# Patient Record
Sex: Female | Born: 1975
Health system: Southern US, Community
[De-identification: ages and names within clinical notes are randomized; demographics above are authoritative.]

## PROBLEM LIST (undated history)

## (undated) DIAGNOSIS — F32A Depression, unspecified: Secondary | ICD-10-CM

## (undated) DIAGNOSIS — F909 Attention-deficit hyperactivity disorder, unspecified type: Secondary | ICD-10-CM

## (undated) DIAGNOSIS — T7840XA Allergy, unspecified, initial encounter: Secondary | ICD-10-CM

## (undated) DIAGNOSIS — F329 Major depressive disorder, single episode, unspecified: Secondary | ICD-10-CM

## (undated) HISTORY — DX: Major depressive disorder, single episode, unspecified: F32.9

## (undated) HISTORY — DX: Attention-deficit hyperactivity disorder, unspecified type: F90.9

## (undated) HISTORY — PX: TUBAL LIGATION: SHX77

## (undated) HISTORY — DX: Depression, unspecified: F32.A

## (undated) HISTORY — PX: FOOT SURGERY: SHX648

## (undated) HISTORY — DX: Allergy, unspecified, initial encounter: T78.40XA

---

## 2006-08-08 ENCOUNTER — Emergency Department: Payer: Self-pay

## 2008-01-09 ENCOUNTER — Ambulatory Visit: Payer: Self-pay | Admitting: Unknown Physician Specialty

## 2008-01-10 ENCOUNTER — Inpatient Hospital Stay: Payer: Self-pay | Admitting: Unknown Physician Specialty

## 2010-01-22 ENCOUNTER — Ambulatory Visit: Payer: Self-pay | Admitting: Unknown Physician Specialty

## 2010-01-23 ENCOUNTER — Inpatient Hospital Stay: Payer: Self-pay | Admitting: Unknown Physician Specialty

## 2010-02-05 LAB — HM PAP SMEAR: HM PAP: NORMAL

## 2011-03-16 ENCOUNTER — Ambulatory Visit: Payer: Self-pay | Admitting: Unknown Physician Specialty

## 2011-04-25 ENCOUNTER — Emergency Department (HOSPITAL_COMMUNITY)
Admission: EM | Admit: 2011-04-25 | Discharge: 2011-04-25 | Disposition: A | Discharge status: Home / Self Care | Payer: BC Managed Care – PPO | Attending: Emergency Medicine | Admitting: Emergency Medicine

## 2011-04-25 ENCOUNTER — Emergency Department (HOSPITAL_COMMUNITY): Payer: BC Managed Care – PPO

## 2011-04-25 DIAGNOSIS — E860 Dehydration: Secondary | ICD-10-CM | POA: Insufficient documentation

## 2011-04-25 DIAGNOSIS — R42 Dizziness and giddiness: Secondary | ICD-10-CM | POA: Insufficient documentation

## 2011-04-25 DIAGNOSIS — I10 Essential (primary) hypertension: Secondary | ICD-10-CM | POA: Insufficient documentation

## 2011-04-25 DIAGNOSIS — R21 Rash and other nonspecific skin eruption: Secondary | ICD-10-CM | POA: Insufficient documentation

## 2011-04-25 DIAGNOSIS — Z79899 Other long term (current) drug therapy: Secondary | ICD-10-CM | POA: Insufficient documentation

## 2011-04-25 DIAGNOSIS — T7840XA Allergy, unspecified, initial encounter: Secondary | ICD-10-CM | POA: Insufficient documentation

## 2011-04-25 LAB — COMPREHENSIVE METABOLIC PANEL
ALT: 20 U/L (ref 0–35)
CO2: 25 mEq/L (ref 19–32)
Calcium: 9.7 mg/dL (ref 8.4–10.5)
Creatinine, Ser: 0.72 mg/dL (ref 0.4–1.2)
GFR calc non Af Amer: >60 mL/min (ref >60)
Glucose, Bld: 125 mg/dL — ABNORMAL HIGH (ref 70–99)

## 2011-04-25 LAB — URINALYSIS, ROUTINE W REFLEX MICROSCOPIC
Bilirubin Urine: NEGATIVE
Hgb urine dipstick: NEGATIVE
Protein, ur: NEGATIVE mg/dL
Urobilinogen, UA: 0.2 mg/dL (ref 0.0–1.0)

## 2011-04-27 LAB — URINE CULTURE: Colony Count: NO GROWTH

## 2011-08-20 ENCOUNTER — Ambulatory Visit (INDEPENDENT_AMBULATORY_CARE_PROVIDER_SITE_OTHER): Payer: BC Managed Care – PPO | Admitting: Internal Medicine

## 2011-08-20 ENCOUNTER — Encounter: Payer: Self-pay | Admitting: Internal Medicine

## 2011-08-20 VITALS — BP 113/79 | HR 111 | Temp 98.2°F | Resp 16 | Ht 64.0 in | Wt 238.5 lb

## 2011-08-20 DIAGNOSIS — G5600 Carpal tunnel syndrome, unspecified upper limb: Secondary | ICD-10-CM

## 2011-08-20 DIAGNOSIS — G8929 Other chronic pain: Secondary | ICD-10-CM | POA: Insufficient documentation

## 2011-08-20 DIAGNOSIS — M25562 Pain in left knee: Secondary | ICD-10-CM | POA: Insufficient documentation

## 2011-08-20 DIAGNOSIS — F329 Major depressive disorder, single episode, unspecified: Secondary | ICD-10-CM

## 2011-08-20 DIAGNOSIS — M25569 Pain in unspecified knee: Secondary | ICD-10-CM

## 2011-08-20 DIAGNOSIS — F32A Depression, unspecified: Secondary | ICD-10-CM

## 2011-08-20 DIAGNOSIS — F909 Attention-deficit hyperactivity disorder, unspecified type: Secondary | ICD-10-CM | POA: Insufficient documentation

## 2011-08-20 DIAGNOSIS — E669 Obesity, unspecified: Secondary | ICD-10-CM

## 2011-08-20 DIAGNOSIS — Z0000 Encounter for general adult medical examination without abnormal findings: Secondary | ICD-10-CM

## 2011-08-20 LAB — COMPREHENSIVE METABOLIC PANEL
Albumin: 4.4 g/dL (ref 3.5–5.2)
Alkaline Phosphatase: 57 U/L (ref 39–117)
BUN: 18 mg/dL (ref 6–23)
Calcium: 9.2 mg/dL (ref 8.4–10.5)
GFR: 107.09 mL/min (ref >60.00)
Glucose, Bld: 89 mg/dL (ref 70–99)
Potassium: 4.3 mEq/L (ref 3.5–5.1)

## 2011-08-20 LAB — CBC WITH DIFFERENTIAL/PLATELET
Eosinophils Relative: 3.6 % (ref 0.0–5.0)
HCT: 41.9 % (ref 36.0–46.0)
Lymphocytes Relative: 28.8 % (ref 12.0–46.0)
Monocytes Relative: 7.9 % (ref 3.0–12.0)
Neutrophils Relative %: 59.2 % (ref 43.0–77.0)
Platelets: 309 10*3/uL (ref 150.0–400.0)
WBC: 8.7 10*3/uL (ref 4.5–10.5)

## 2011-08-20 LAB — TSH: TSH: 0.53 u[IU]/mL (ref 0.35–5.50)

## 2011-08-20 MED ORDER — LISDEXAMFETAMINE DIMESYLATE 20 MG PO CAPS: 20.0000 mg | ORAL_CAPSULE | ORAL | Status: DC

## 2011-08-20 MED ORDER — BUPROPION HCL ER (SR) 150 MG PO TB12: 150.0000 mg | ORAL_TABLET | Freq: Two times a day (BID) | ORAL | Status: DC

## 2011-08-20 NOTE — Progress Notes (Signed)
Subjective:    Patient ID: Taylor Wagner, female    DOB: [birth year], 35 y.o.   MRN: [id number]  HPI  Taylor Wagner is a 35 year old female who presents to establish care. She has several concerns today. First she is concerned about inability to lose weight. She reports being overweight for most of her life. She reports losing approximately 50 pounds prior to the birth of her first child. However with each subsequent pregnancy she gained more weight. Currently she reports being very active with her 3 young children. She denies any scheduled exercise. She reports eating a relatively healthy diet. Typically cereal for breakfast and small meals for lunch and dinner. She denies intake of sweet beverages, with the exception of a rare Pepsi. She has not been documenting her food intake or caloric intake. She has not tried regimented program's such as Weight Watchers. She has tried Topamax in the past for weight loss. She reports no weight loss with this medication.  Taylor Wagner is also concerned about numbness in both of her hands. This is most prominent at night. She works as a Equities trader during the day. She has not tried wearing a wrist brace. She has never been diagnosed with carpal tunnel syndrome. She denies any weakness in her hands. She denies any other focal neurologic symptoms.  Taylor Wagner has a history of ADD per her report. Her previous physician had tried her on Adderall, however she reports minimal improvement on this. She denies any problems with elevated blood pressure, palpitations, agitation on the medication. She does note at her last visit her doctor increase both her dose of Adderall and her dose of Wellbutrin in an effort to improve her concentration. She has been on Wellbutrin for several years to help with anxiety and depression. She reports that she first developed depression after the birth of her first child. She feels that her depression is well controlled at this time.  Taylor Wagner also  reports a history of left anterior knee pain. This started approximately 4-5 months ago. She denies any trauma to her knee or her known injury. She has been using nonsteroidals as needed with relief of pain. She reports her knee hurts most of the time and pain is not worsened by movement. She denies any swelling or erythema of her knee.   Outpatient Encounter Prescriptions as of 08/20/2011  Medication Sig Dispense Refill  . CHOLINE-INOSITOL-METHIONINE-FA PO Take 1 tablet by mouth daily.        . Loratadine 10 MG CAPS Take 1 capsule by mouth daily.        . Multiple Vitamin (MULTIVITAMIN) tablet Take 1 tablet by mouth daily.        . sertraline (ZOLOFT) 100 MG tablet Take 100 mg by mouth daily. Take one and a half tablet daily       . DISCONTD: amphetamine-dextroamphetamine (ADDERALL) 10 MG tablet Take 10 mg by mouth 4 (four) times daily.        Marland Kitchen DISCONTD: buPROPion (WELLBUTRIN XL) 150 MG 24 hr tablet Take 150 mg by mouth daily. Take one to three tablets daily       . buPROPion (WELLBUTRIN SR) 150 MG 12 hr tablet Take 1 tablet (150 mg total) by mouth 2 (two) times daily.  90 tablet  4  . lisdexamfetamine (VYVANSE) 20 MG capsule Take 1 capsule (20 mg total) by mouth every morning.  30 capsule  0  . DISCONTD: buPROPion (WELLBUTRIN SR) 150 MG 12 hr tablet Take 1  tablet (150 mg total) by mouth 2 (two) times daily.  90 tablet  4    Review of Systems  Constitutional: Negative for fever, chills, appetite change, fatigue and unexpected weight change.  HENT: Negative for ear pain, congestion, sore throat, trouble swallowing, neck pain, voice change and sinus pressure.   Eyes: Negative for visual disturbance.  Respiratory: Negative for cough, shortness of breath, wheezing and stridor.   Cardiovascular: Negative for chest pain, palpitations and leg swelling.  Gastrointestinal: Negative for nausea, vomiting, abdominal pain, diarrhea, constipation, blood in stool, abdominal distention and anal bleeding.    Genitourinary: Negative for dysuria and flank pain.  Musculoskeletal: Negative for myalgias, arthralgias and gait problem.  Skin: Negative for color change and rash.  Neurological: Negative for dizziness and headaches.  Hematological: Negative for adenopathy. Does not bruise/bleed easily.  Psychiatric/Behavioral: Positive for dysphoric mood and decreased concentration. Negative for suicidal ideas and sleep disturbance. The patient is nervous/anxious.    BP 113/79  Pulse 111  Temp(Src) 98.2 F (36.8 C) (Oral)  Resp 16  Ht 5\' 4"  (1.626 m)  Wt 238 lb 8 oz (108.183 kg)  BMI 40.94 kg/m2  SpO2 100%  LMP 08/03/2011     Objective:   Physical Exam  Constitutional: She is oriented to person, place, and time. She appears well-developed and well-nourished. No distress.  HENT:  Head: Normocephalic and atraumatic.  Right Ear: External ear normal.  Left Ear: External ear normal.  Nose: Nose normal.  Mouth/Throat: Oropharynx is clear and moist. No oropharyngeal exudate.  Eyes: Conjunctivae are normal. Pupils are equal, round, and reactive to light. Right eye exhibits no discharge. Left eye exhibits no discharge. No scleral icterus.  Neck: Normal range of motion. Neck supple. No tracheal deviation present. No thyromegaly present.  Cardiovascular: Normal rate, regular rhythm, normal heart sounds and intact distal pulses.  Exam reveals no gallop and no friction rub.   No murmur heard. Pulmonary/Chest: Effort normal and breath sounds normal. No respiratory distress. She has no wheezes. She has no rales. She exhibits no tenderness.  Abdominal: Soft. Bowel sounds are normal. She exhibits no distension and no mass. There is no tenderness. There is no rebound and no guarding.  Musculoskeletal: Normal range of motion. She exhibits no edema and no tenderness.  Lymphadenopathy:    She has no cervical adenopathy.  Neurological: She is alert and oriented to person, place, and time. She has normal strength.  No cranial nerve deficit. She exhibits normal muscle tone. Coordination normal.  Skin: Skin is warm and dry. No rash noted. She is not diaphoretic. No erythema. No pallor.  Psychiatric: Her behavior is normal. Judgment and thought content normal. Her mood appears anxious. Cognition and memory are normal.          Assessment & Plan:  1. Obesity - patient obese with BMI of 40. She is not a good candidate for appetite suppressants because of her ongoing use of stimulant medications for ADD. I encouraged her to keep a food diary and record everything that she drinks. She will bring this with her to her next visit so that we can review. I encouraged her to try to keep her calories under 1200 per day. I also encouraged her to schedule time for exercise. She will try to exercise in the mornings prior to her husband leaving for work. Our goal be for her to lose 1-2 pounds every week. We will also check thyroid function with labs today. She will return to clinic in  one month.  2. ADD - patient reports a history of ADD. Her previous primary care physician had been treating her with Adderall. She is currently taking 10 mg of Adderall 4 times per day, and reports no improvement at this dose. She denies any side effects from the medication such as agitation, palpitations. I am concerned with having her on both Adderall and Wellbutrin giving that both have stimulant effects. We will try changing her medication to Vyvanse. She will return to clinic in one month for recheck.  3. Anxiety/Depression - patient with history of anxiety and depression. She reports it is well controlled on Wellbutrin, however I'm concerned about her dose. She is currently taking 450 mg of Wellbutrin daily which exceeds the max dose. We discussed the risk of seizures on this medication. We will reduce her dose to 300 mg daily. She will return to clinic in one month to reassess.  4. Carpal Tunnel - patient complains of numbness in both of  her hands. Presentation and exam are consistent with carpal tunnel syndrome. I encouraged her to use wrist brace on both of her hands. She will wear these at night and as much as possible during the day. We discussed that if she has no improvement she may need to be referred for surgical decompression of the median nerve. She will return to clinic in one month to reassess.  5. Knee pain - exam is normal. Pain is in the anterior lower knee consistent with infrapatellar bursitis. Encouraged her to use advil 600-800 mg every 8 hours for the next 3-5 days. Encouraged her to use a knee brace as well. Should she have no improvement we will consider further evaluation with MRI.

## 2011-08-27 ENCOUNTER — Other Ambulatory Visit: Payer: Self-pay | Admitting: *Deleted

## 2011-08-27 DIAGNOSIS — F32A Depression, unspecified: Secondary | ICD-10-CM

## 2011-08-27 DIAGNOSIS — F329 Major depressive disorder, single episode, unspecified: Secondary | ICD-10-CM

## 2011-08-27 NOTE — Telephone Encounter (Signed)
Pharmacy requires rx to be a 90 day supply. Rx was originally sent with quantity of 90 and she takes it bid. Is it okay for me to change to quantity of 180. Please advise.

## 2011-08-28 MED ORDER — BUPROPION HCL ER (SR) 150 MG PO TB12: 150.0000 mg | ORAL_TABLET | Freq: Two times a day (BID) | ORAL | Status: DC

## 2011-08-31 ENCOUNTER — Telehealth: Payer: Self-pay | Admitting: Internal Medicine

## 2011-08-31 NOTE — Telephone Encounter (Signed)
Patient feels like she needs to increase the vyvanse, she doesn't feel like it is helping as much as it should. Please advise.

## 2011-08-31 NOTE — Telephone Encounter (Signed)
OK. Let's have her increase to 40mg  daily. She can take two of the pills she has now, then we can move her appointment up to next week, so that she will not run out prior to being seen. Thanks

## 2011-08-31 NOTE — Telephone Encounter (Signed)
Patient notified.  Appt scheduled for next week.

## 2011-08-31 NOTE — Telephone Encounter (Signed)
?   On viviance pt thinks this needs to be upped  On welbrutin pt has taken care of this with her insurance

## 2011-09-08 ENCOUNTER — Ambulatory Visit: Payer: BC Managed Care – PPO | Admitting: Internal Medicine

## 2011-09-09 ENCOUNTER — Telehealth: Payer: Self-pay | Admitting: Internal Medicine

## 2011-09-09 NOTE — Telephone Encounter (Signed)
[facility] in [place] would not take her rx for vivance.  Please write another rx pt is out of meds  Please call pt when ready

## 2011-09-10 ENCOUNTER — Other Ambulatory Visit: Payer: Self-pay | Admitting: Internal Medicine

## 2011-09-10 MED ORDER — LISDEXAMFETAMINE DIMESYLATE 20 MG PO CAPS: 20.0000 mg | ORAL_CAPSULE | ORAL | Status: DC

## 2011-09-16 ENCOUNTER — Ambulatory Visit (INDEPENDENT_AMBULATORY_CARE_PROVIDER_SITE_OTHER): Payer: BC Managed Care – PPO | Admitting: Internal Medicine

## 2011-09-16 ENCOUNTER — Encounter: Payer: Self-pay | Admitting: Internal Medicine

## 2011-09-16 VITALS — BP 131/77 | HR 102 | Temp 98.7°F | Resp 20 | Ht 64.0 in | Wt 247.0 lb

## 2011-09-16 DIAGNOSIS — G5600 Carpal tunnel syndrome, unspecified upper limb: Secondary | ICD-10-CM

## 2011-09-16 DIAGNOSIS — F329 Major depressive disorder, single episode, unspecified: Secondary | ICD-10-CM

## 2011-09-16 DIAGNOSIS — F909 Attention-deficit hyperactivity disorder, unspecified type: Secondary | ICD-10-CM

## 2011-09-16 DIAGNOSIS — F32A Depression, unspecified: Secondary | ICD-10-CM

## 2011-09-16 MED ORDER — LISDEXAMFETAMINE DIMESYLATE 40 MG PO CAPS: 40.0000 mg | ORAL_CAPSULE | ORAL | Status: DC

## 2011-09-16 NOTE — Patient Instructions (Signed)
Increase Vyvanse to 40mg  daily. Follow up in 1 month.

## 2011-09-16 NOTE — Progress Notes (Signed)
Subjective:    Patient ID: Taylor Wagner, female    DOB: Oct 12, 1976, 35 y.o.   MRN: [id number]  HPI  Taylor Wagner is a 35 year old female with a history of ADD who presents for followup. She reports that since starting 5 and she has noted some improvement in her symptoms of difficulty concentrating. Her symptoms tend to worsen throughout the day. She questions whether she may need a higher dose.  At her last visit we reduced her dose of Wellbutrin to 300 mg. Her previous primary care physician had her on 450 mg daily which is above the maximal dose. She reports that on 300 mg of Wellbutrin daily her symptoms of anxiety and depression seem well-controlled.  She complains today of persistent bilateral wrist pain. She has tried using wrist braces at night. She has not had success with this. Notably she has 3 young children and frequently lifts them. She has some weakness in her bilateral hands and occasional numbness and tingling in her thumb and first 2 fingers on both hands.   Outpatient Encounter Prescriptions as of 09/16/2011  Medication Sig Dispense Refill  . Loratadine 10 MG CAPS Take 1 capsule by mouth daily.        . Multiple Vitamin (MULTIVITAMIN) tablet Take 1 tablet by mouth daily.        . sertraline (ZOLOFT) 100 MG tablet Take 100 mg by mouth daily. Take one and a half tablet daily       . DISCONTD: lisdexamfetamine (VYVANSE) 20 MG capsule Take 1 capsule (20 mg total) by mouth every morning.  30 capsule  0  . buPROPion (WELLBUTRIN SR) 150 MG 12 hr tablet Take 1 tablet (150 mg total) by mouth 2 (two) times daily.  180 tablet  4    Review of Systems  Constitutional: Negative for fever and chills.  Musculoskeletal: Positive for myalgias and arthralgias.  Psychiatric/Behavioral: Positive for decreased concentration.   BP 131/77  Pulse 102  Temp(Src) 98.7 F (37.1 C) (Oral)  Resp 20  Ht 5\' 4"  (1.626 m)  Wt 247 lb (112.038 kg)  BMI 42.40 kg/m2  SpO2 98%  LMP 08/03/2011       Objective:   Physical Exam  Constitutional: She is oriented to person, place, and time. She appears well-developed and well-nourished. No distress.  HENT:  Head: Normocephalic and atraumatic.  Right Ear: External ear normal.  Left Ear: External ear normal.  Nose: Nose normal.  Mouth/Throat: Oropharynx is clear and moist. No oropharyngeal exudate.  Eyes: Conjunctivae are normal. Pupils are equal, round, and reactive to light. Right eye exhibits no discharge. Left eye exhibits no discharge. No scleral icterus.  Neck: Normal range of motion. Neck supple. No tracheal deviation present. No thyromegaly present.  Cardiovascular: Normal rate, regular rhythm, normal heart sounds and intact distal pulses.  Exam reveals no gallop and no friction rub.   No murmur heard. Pulmonary/Chest: Effort normal and breath sounds normal. No respiratory distress. She has no wheezes. She has no rales. She exhibits no tenderness.  Musculoskeletal: Normal range of motion. She exhibits no edema and no tenderness.  Lymphadenopathy:    She has no cervical adenopathy.  Neurological: She is alert and oriented to person, place, and time. No cranial nerve deficit. She exhibits normal muscle tone. Coordination normal.  Skin: Skin is warm and dry. No rash noted. She is not diaphoretic. No erythema. No pallor.  Psychiatric: She has a normal mood and affect. Her behavior is normal. Judgment and thought content  normal.          Assessment & Plan:  1. ADD - doing well on Vyvanse. Will increase dose to 40 mg daily. We'll have her followup in one month.  2. Depression - symptoms of depression and anxiety are well controlled on a lower dose of Wellbutrin at 300 mg daily. We'll plan to continue this dose for now. We will revisit this in one month.  3. Carpal Tunnel - patient with persistent and progressive symptoms of carpal tunnel in both of her wrists. She has failed conservative therapy including anti-inflammatories and use  of wrist braces. We will set her up with orthopedic surgery for evaluation.

## 2011-10-01 ENCOUNTER — Other Ambulatory Visit: Payer: Self-pay | Admitting: Internal Medicine

## 2011-10-01 ENCOUNTER — Telehealth: Payer: Self-pay | Admitting: Internal Medicine

## 2011-10-01 DIAGNOSIS — F988 Other specified behavioral and emotional disorders with onset usually occurring in childhood and adolescence: Secondary | ICD-10-CM

## 2011-10-01 MED ORDER — LISDEXAMFETAMINE DIMESYLATE 60 MG PO CAPS: 60.0000 mg | ORAL_CAPSULE | ORAL | Status: DC

## 2011-10-01 NOTE — Telephone Encounter (Signed)
Patient wants a higher dosage for her Vyvanse.

## 2011-10-01 NOTE — Telephone Encounter (Signed)
OK. We can print new Rx for Vyvanse 60mg  po daily, disp 30 no refill. She must keep her follow up appointment.

## 2011-10-02 NOTE — Telephone Encounter (Signed)
Done

## 2011-10-27 ENCOUNTER — Encounter: Payer: Self-pay | Admitting: Internal Medicine

## 2011-10-27 ENCOUNTER — Ambulatory Visit (INDEPENDENT_AMBULATORY_CARE_PROVIDER_SITE_OTHER): Payer: BC Managed Care – PPO | Admitting: Internal Medicine

## 2011-10-27 VITALS — BP 128/70 | HR 90 | Temp 98.5°F | Wt 240.0 lb

## 2011-10-27 DIAGNOSIS — E669 Obesity, unspecified: Secondary | ICD-10-CM

## 2011-10-27 DIAGNOSIS — F988 Other specified behavioral and emotional disorders with onset usually occurring in childhood and adolescence: Secondary | ICD-10-CM

## 2011-10-27 MED ORDER — LISDEXAMFETAMINE DIMESYLATE 70 MG PO CAPS: 70.0000 mg | ORAL_CAPSULE | ORAL | Status: DC

## 2011-10-27 NOTE — Patient Instructions (Signed)
[employer] - cookbook and blog

## 2011-10-27 NOTE — Progress Notes (Signed)
Subjective:    Patient ID: Taylor Wagner, female    DOB: Aug 02, 1976, 35 y.o.   MRN: [id number]  HPI 35YO female presents to follow up ADD. Reports improvement on Vyvanse 60mg  dose, but still having some difficulty with concentration, and motivation.  Reports mild increase in anxiety with increase in stimulant dose. No headache, palpitations. No weight loss.  Pt is concerned about weight. Reports husband has made efforts in home to limit intake of meats and dairy products, but pt has not been able to restrict calories to extent to lose weight. Not keeping food log. Not exercising regularly.  Outpatient Encounter Prescriptions as of 10/27/2011  Medication Sig Dispense Refill  . buPROPion (WELLBUTRIN SR) 150 MG 12 hr tablet Take 1 tablet (150 mg total) by mouth 2 (two) times daily.  180 tablet  4  . Loratadine 10 MG CAPS Take 1 capsule by mouth daily.        . Multiple Vitamin (MULTIVITAMIN) tablet Take 1 tablet by mouth daily.        . sertraline (ZOLOFT) 100 MG tablet Take 100 mg by mouth daily. Take one and a half tablet daily       . DISCONTD: lisdexamfetamine (VYVANSE) 60 MG capsule Take 1 capsule (60 mg total) by mouth every morning.  30 capsule  0  . lisdexamfetamine (VYVANSE) 70 MG capsule Take 1 capsule (70 mg total) by mouth every morning.  30 capsule  0    Review of Systems  Constitutional: Negative for fever, chills, appetite change, fatigue and unexpected weight change.  Eyes: Negative for visual disturbance.  Respiratory: Negative for cough and shortness of breath.   Cardiovascular: Negative for chest pain, palpitations and leg swelling.  Gastrointestinal: Negative for vomiting, abdominal pain, diarrhea and constipation.  Genitourinary: Negative for dysuria and flank pain.  Musculoskeletal: Negative for myalgias, arthralgias and gait problem.  Skin: Negative for color change and rash.  Neurological: Negative for dizziness and headaches.  Hematological: Negative for  adenopathy. Does not bruise/bleed easily.  Psychiatric/Behavioral: Positive for decreased concentration. Negative for suicidal ideas, sleep disturbance and dysphoric mood. The patient is nervous/anxious.    BP 128/70  Pulse 90  Temp(Src) 98.5 F (36.9 C) (Oral)  Wt 240 lb (108.863 kg)  SpO2 96%     Objective:   Physical Exam  Constitutional: She is oriented to person, place, and time. She appears well-developed and well-nourished. No distress.  HENT:  Head: Normocephalic and atraumatic.  Right Ear: External ear normal.  Left Ear: External ear normal.  Nose: Nose normal.  Mouth/Throat: Oropharynx is clear and moist. No oropharyngeal exudate.  Eyes: Conjunctivae are normal. Pupils are equal, round, and reactive to light. Right eye exhibits no discharge. Left eye exhibits no discharge. No scleral icterus.  Neck: Normal range of motion. Neck supple. No tracheal deviation present. No thyromegaly present.  Cardiovascular: Normal rate, regular rhythm, normal heart sounds and intact distal pulses.  Exam reveals no gallop and no friction rub.   No murmur heard. Pulmonary/Chest: Effort normal and breath sounds normal. No respiratory distress. She has no wheezes. She has no rales. She exhibits no tenderness.  Abdominal: Soft. Bowel sounds are normal. She exhibits no distension and no mass. There is no tenderness. There is no rebound and no guarding.  Genitourinary: Rectum normal, vagina normal and uterus normal. No breast swelling, tenderness, discharge or bleeding. Pelvic exam was performed with patient prone. There is no rash, tenderness or lesion on the right labia. There is no  rash, tenderness or lesion on the left labia. Uterus is not enlarged and not tender. Cervix exhibits no motion tenderness, no discharge and no friability. Right adnexum displays no mass, no tenderness and no fullness. Left adnexum displays no mass, no tenderness and no fullness. No erythema or tenderness around the vagina.  No vaginal discharge found.  Musculoskeletal: Normal range of motion. She exhibits no edema and no tenderness.  Lymphadenopathy:    She has no cervical adenopathy.  Neurological: She is alert and oriented to person, place, and time. No cranial nerve deficit. She exhibits normal muscle tone. Coordination normal.  Skin: Skin is warm and dry. No rash noted. She is not diaphoretic. No erythema. No pallor.  Psychiatric: She has a normal mood and affect. Her behavior is normal. Judgment and thought content normal.          Assessment & Plan:  1. ADD - Minimal improvement with treatment with Vyvanse. Will try increase dose to 70mg  daily, but suspect that she will need alternative medication. Has used adderall in the past, with little improvement.  Question if underlying depression/anxiety is affecting concentration/focus. Would prefer to set her up with psychiatry for more complete evaluation.  Will schedule referral. She will follow up here in 1 month.  2. Obesity - Encouraged her to limit intake of saturated fat and processed carbohydrates. Encouraged limiting calories to 1200-1500 per day and keeping food diary. Will follow up 1 month.

## 2011-11-05 ENCOUNTER — Telehealth: Payer: Self-pay | Admitting: Internal Medicine

## 2011-11-05 MED ORDER — SERTRALINE HCL 100 MG PO TABS: 150.0000 mg | ORAL_TABLET | Freq: Every day | ORAL | Status: DC

## 2011-11-05 NOTE — Telephone Encounter (Signed)
[phone] Pt called she need you to fax a new rx to  [facility]   Phone # [phone] For zoloft up to 150 mg Please call pt when rx is faxed

## 2011-11-05 NOTE — Telephone Encounter (Signed)
Called # and Faxed RX to [phone]. Patient informed

## 2011-11-13 ENCOUNTER — Telehealth: Payer: Self-pay | Admitting: *Deleted

## 2011-11-13 NOTE — Telephone Encounter (Signed)
Received form regarding PA for sertraline. Pending completion by MD.

## 2011-11-16 NOTE — Telephone Encounter (Signed)
PA form faxed.  

## 2011-11-25 ENCOUNTER — Telehealth: Payer: Self-pay | Admitting: Internal Medicine

## 2011-11-25 DIAGNOSIS — F988 Other specified behavioral and emotional disorders with onset usually occurring in childhood and adolescence: Secondary | ICD-10-CM

## 2011-11-25 NOTE — Telephone Encounter (Signed)
Patient requesting RF of Vyvanse.  

## 2011-11-25 NOTE — Telephone Encounter (Signed)
Fine to refill 1 month

## 2011-11-26 MED ORDER — LISDEXAMFETAMINE DIMESYLATE 70 MG PO CAPS: 70.0000 mg | ORAL_CAPSULE | ORAL | Status: DC

## 2011-11-26 NOTE — Telephone Encounter (Signed)
Left pt VM that RX would be ready for pick up after 1 today

## 2011-11-26 NOTE — Telephone Encounter (Signed)
Pending signature

## 2011-11-30 ENCOUNTER — Ambulatory Visit: Payer: BC Managed Care – PPO | Admitting: Internal Medicine

## 2011-12-24 ENCOUNTER — Telehealth: Payer: Self-pay | Admitting: Internal Medicine

## 2011-12-24 DIAGNOSIS — F988 Other specified behavioral and emotional disorders with onset usually occurring in childhood and adolescence: Secondary | ICD-10-CM

## 2011-12-24 NOTE — Telephone Encounter (Signed)
Please advise in Dr Walkers absence. 

## 2011-12-24 NOTE — Telephone Encounter (Signed)
Ok to refill 30 days 

## 2011-12-24 NOTE — Telephone Encounter (Signed)
Patient is needing a refill on her Vyvanse 70 mg.

## 2011-12-25 MED ORDER — LISDEXAMFETAMINE DIMESYLATE 70 MG PO CAPS: 70.0000 mg | ORAL_CAPSULE | ORAL | Status: AC

## 2011-12-25 NOTE — Telephone Encounter (Signed)
Left message advising patient that rx is ready to be picked up. Place in front office.

## 2011-12-25 NOTE — Telephone Encounter (Signed)
Pending signature

## 2012-05-09 ENCOUNTER — Encounter: Payer: Self-pay | Admitting: Internal Medicine

## 2012-05-09 ENCOUNTER — Ambulatory Visit (INDEPENDENT_AMBULATORY_CARE_PROVIDER_SITE_OTHER): Payer: BC Managed Care – PPO | Admitting: Internal Medicine

## 2012-05-09 VITALS — BP 123/71 | HR 93 | Temp 98.4°F | Resp 16 | Ht 64.75 in | Wt 261.5 lb

## 2012-05-09 DIAGNOSIS — L509 Urticaria, unspecified: Secondary | ICD-10-CM | POA: Insufficient documentation

## 2012-05-09 DIAGNOSIS — R202 Paresthesia of skin: Secondary | ICD-10-CM

## 2012-05-09 DIAGNOSIS — R209 Unspecified disturbances of skin sensation: Secondary | ICD-10-CM

## 2012-05-09 DIAGNOSIS — E669 Obesity, unspecified: Secondary | ICD-10-CM

## 2012-05-09 DIAGNOSIS — F909 Attention-deficit hyperactivity disorder, unspecified type: Secondary | ICD-10-CM

## 2012-05-09 MED ORDER — PHENTERMINE HCL 37.5 MG PO CAPS: 37.5000 mg | ORAL_CAPSULE | ORAL | Status: DC

## 2012-05-09 MED ORDER — EPINEPHRINE 0.3 MG/0.3ML IJ DEVI: 0.3000 mg | Freq: Once | INTRAMUSCULAR | Status: DC

## 2012-05-09 NOTE — Assessment & Plan Note (Signed)
Symptoms of recurrent diffuse urticaria. Unclear allergen. Will set up referral to allergy immunology for further testing. Will write for EpiPen for her to use in setting of anaphylaxis.

## 2012-05-09 NOTE — Assessment & Plan Note (Signed)
BMI 43. Discussed starting phentermine to help with appetite suppression. Will start this and we'll have patient followup in one month for recheck. Discussed potential side effects including palpitations, constipation, dry mouth. She will call if any questions or concerns.

## 2012-05-09 NOTE — Assessment & Plan Note (Signed)
Patient is concerned about possibility of Lyme disease causing paresthesia. We discussed potential testing for this. She would like to hold off on this for now. If symptoms persist, will send testing. We also discussed other potential etiologies such as thyroid abnormalities and B12 deficiency.

## 2012-05-09 NOTE — Assessment & Plan Note (Signed)
Currently off medications, however patient reports she is doing well. We'll continue to monitor.

## 2012-05-09 NOTE — Progress Notes (Signed)
Subjective:    Patient ID: Taylor Wagner, female    DOB: 09-29-1976, 36 y.o.   MRN: [id number]  HPI 36 year old female with history of ADD presents for followup. She reports that she stopped taking her Vyvanse because of the cost of this medication. She feels that she is doing fine in terms of ability to concentrate without the medicine.  She has several concerns today. First, she would like to start medication to help suppress her appetite. She reports that she has used Topamax in the past with some success. She is not currently following an exercise or diet program.  Secondly, she notes recently she has had 2 episodes where she suddenly developed diffuse urticaria and difficulty breathing. These episodes resulted in emergency room visits requiring steroid tapers. She has never had further evaluation as to the cause of her allergies.  Third, she questions whether she may have a history of Lyme disease. She reports being bit by a tick last year and having a bull's-eye lesion. Since that time, she has had some tingling in her hands and feet. She questions whether she may have chronic Lyme disease.  Outpatient Encounter Prescriptions as of 05/09/2012  Medication Sig Dispense Refill  . Biotin 1000 MCG tablet Take 1,000 mcg by mouth daily.      Marland Kitchen buPROPion (WELLBUTRIN SR) 150 MG 12 hr tablet Take 1 tablet (150 mg total) by mouth 2 (two) times daily.  180 tablet  4  . Calcium Carbonate-Vitamin D (CALCIUM + D PO) Take by mouth daily.      . Cholecalciferol (VITAMIN D-3 PO) Take by mouth daily.      . fish oil-omega-3 fatty acids 1000 MG capsule Take 2 g by mouth daily.      . Loratadine 10 MG CAPS Take 1 capsule by mouth daily.        . sertraline (ZOLOFT) 100 MG tablet Take 1.5 tablets (150 mg total) by mouth daily.  135 tablet  3  . EPINEPHrine (EPIPEN) 0.3 mg/0.3 mL DEVI Inject 0.3 mLs (0.3 mg total) into the muscle once.  2 Device  2  . Multiple Vitamin (MULTIVITAMIN) tablet Take 1 tablet by  mouth daily.        . phentermine 37.5 MG capsule Take 1 capsule (37.5 mg total) by mouth every morning.  30 capsule  1    Review of Systems  Constitutional: Negative for fever, chills, appetite change, fatigue and unexpected weight change.  HENT: Negative for ear pain, congestion, sore throat, trouble swallowing, neck pain, voice change and sinus pressure.   Eyes: Negative for visual disturbance.  Respiratory: Negative for cough, shortness of breath, wheezing and stridor.   Cardiovascular: Negative for chest pain, palpitations and leg swelling.  Gastrointestinal: Negative for nausea, vomiting, abdominal pain, diarrhea, constipation, blood in stool, abdominal distention and anal bleeding.  Genitourinary: Negative for dysuria and flank pain.  Musculoskeletal: Negative for myalgias, arthralgias and gait problem.  Skin: Negative for color change and rash.  Neurological: Positive for numbness. Negative for dizziness and headaches.  Hematological: Negative for adenopathy. Does not bruise/bleed easily.  Psychiatric/Behavioral: Negative for suicidal ideas, sleep disturbance and dysphoric mood. The patient is not nervous/anxious.    BP 123/71  Pulse 93  Temp(Src) 98.4 F (36.9 C) (Oral)  Resp 16  Ht 5' 4.75" (1.645 m)  Wt 261 lb 8 oz (118.616 kg)  BMI 43.85 kg/m2  SpO2 100%  LMP 04/25/2012     Objective:   Physical Exam  Constitutional: She  is oriented to person, place, and time. She appears well-developed and well-nourished. No distress.  HENT:  Head: Normocephalic and atraumatic.  Right Ear: External ear normal.  Left Ear: External ear normal.  Nose: Nose normal.  Mouth/Throat: Oropharynx is clear and moist. No oropharyngeal exudate.  Eyes: Conjunctivae are normal. Pupils are equal, round, and reactive to light. Right eye exhibits no discharge. Left eye exhibits no discharge. No scleral icterus.  Neck: Normal range of motion. Neck supple. No tracheal deviation present. No  thyromegaly present.  Cardiovascular: Normal rate, regular rhythm, normal heart sounds and intact distal pulses.  Exam reveals no gallop and no friction rub.   No murmur heard. Pulmonary/Chest: Effort normal and breath sounds normal. No respiratory distress. She has no wheezes. She has no rales. She exhibits no tenderness.  Musculoskeletal: Normal range of motion. She exhibits no edema and no tenderness.  Lymphadenopathy:    She has no cervical adenopathy.  Neurological: She is alert and oriented to person, place, and time. No cranial nerve deficit. She exhibits normal muscle tone. Coordination normal.  Skin: Skin is warm and dry. No rash noted. She is not diaphoretic. No erythema. No pallor.  Psychiatric: She has a normal mood and affect. Her behavior is normal. Judgment and thought content normal.          Assessment & Plan:

## 2012-06-29 ENCOUNTER — Ambulatory Visit: Payer: BC Managed Care – PPO | Admitting: Internal Medicine

## 2012-07-29 ENCOUNTER — Encounter: Payer: Self-pay | Admitting: Internal Medicine

## 2012-07-29 ENCOUNTER — Ambulatory Visit (INDEPENDENT_AMBULATORY_CARE_PROVIDER_SITE_OTHER): Payer: BC Managed Care – PPO | Admitting: Internal Medicine

## 2012-07-29 VITALS — BP 152/90 | HR 95 | Temp 99.0°F | Ht 64.75 in | Wt 264.5 lb

## 2012-07-29 DIAGNOSIS — IMO0001 Reserved for inherently not codable concepts without codable children: Secondary | ICD-10-CM

## 2012-07-29 DIAGNOSIS — R030 Elevated blood-pressure reading, without diagnosis of hypertension: Secondary | ICD-10-CM

## 2012-07-29 DIAGNOSIS — L508 Other urticaria: Secondary | ICD-10-CM

## 2012-07-29 DIAGNOSIS — E669 Obesity, unspecified: Secondary | ICD-10-CM

## 2012-07-29 MED ORDER — EPINEPHRINE 0.3 MG/0.3ML IJ DEVI: 0.3000 mg | Freq: Once | INTRAMUSCULAR | Status: DC

## 2012-07-29 MED ORDER — TOPIRAMATE 25 MG PO TABS: 25.0000 mg | ORAL_TABLET | Freq: Two times a day (BID) | ORAL | Status: DC

## 2012-07-29 NOTE — Assessment & Plan Note (Signed)
No improvement in appetite with use of phentermine. Will try using Topamax. We discussed that patient must use birth control while taking this medication. She will start 25 mg twice daily. We'll plan to taper up to 50 mg twice daily next week if she is tolerating well. Followup in one month.

## 2012-07-29 NOTE — Assessment & Plan Note (Signed)
S/p evaluation by  Allergy. Several allergies noted and pt trying to consciously avoid some triggers such as processed meats. Will continue to monitor and use Epi pen prn.

## 2012-07-29 NOTE — Progress Notes (Signed)
Subjective:    Patient ID: Taylor Wagner, female    DOB: 04-17-1976, 36 y.o.   MRN: [id number]  HPI 36 year old female with history of obesity and chronic urticaria presents for followup. In regards to her obesity, at her last visit she was started on phentermine. She reports no improvement in her appetite with this medication. She ultimately stopped taking the medication. She is not following any specific diet or exercise program. She does note increased intake of soda.  In regards to chronic urticaria, she notes that she was seen at allergist and had skin testing which showed that she is allergic to several things including cats and dogs. She also notes increased frequency of hives when she eats meats, particularly processed meat. She has tried avoiding these triggers in order to help with urticarial symptoms. Symptoms have been severe including hives, swelling in her face and shortness of breath. She carries an EpiPen.  Outpatient Encounter Prescriptions as of 07/29/2012  Medication Sig Dispense Refill  . buPROPion (WELLBUTRIN SR) 150 MG 12 hr tablet Take 1 tablet (150 mg total) by mouth 2 (two) times daily.  180 tablet  4  . Calcium Carbonate-Vitamin D (CALCIUM + D PO) Take by mouth daily.      . Cholecalciferol (VITAMIN D-3 PO) Take by mouth daily.      Marland Kitchen EPINEPHrine (EPIPEN) 0.3 mg/0.3 mL DEVI Inject 0.3 mLs (0.3 mg total) into the muscle once.  2 Device  2  . Loratadine 10 MG CAPS Take 1 capsule by mouth daily.        . Multiple Vitamin (MULTIVITAMIN) tablet Take 1 tablet by mouth daily.        . sertraline (ZOLOFT) 100 MG tablet Take 1.5 tablets (150 mg total) by mouth daily.  135 tablet  3  . topiramate (TOPAMAX) 25 MG tablet Take 1 tablet (25 mg total) by mouth 2 (two) times daily.  60 tablet  3   BP 152/90  Pulse 95  Temp 99 F (37.2 C) (Oral)  Ht 5' 4.75" (1.645 m)  Wt 264 lb 8 oz (119.976 kg)  BMI 44.36 kg/m2  SpO2 99%  LMP 06/24/2012  Review of Systems  Constitutional:  Negative for fever, chills, appetite change, fatigue and unexpected weight change.  HENT: Negative for ear pain, congestion, sore throat, trouble swallowing, neck pain, voice change and sinus pressure.   Eyes: Negative for visual disturbance.  Respiratory: Negative for cough, shortness of breath, wheezing and stridor.   Cardiovascular: Negative for chest pain, palpitations and leg swelling.  Gastrointestinal: Negative for nausea, vomiting, abdominal pain, diarrhea, constipation, blood in stool, abdominal distention and anal bleeding.  Genitourinary: Negative for dysuria and flank pain.  Musculoskeletal: Negative for myalgias, arthralgias and gait problem.  Skin: Negative for color change and rash.  Neurological: Negative for dizziness and headaches.  Hematological: Negative for adenopathy. Does not bruise/bleed easily.  Psychiatric/Behavioral: Negative for suicidal ideas, disturbed wake/sleep cycle and dysphoric mood. The patient is not nervous/anxious.        Objective:   Physical Exam  Constitutional: She is oriented to person, place, and time. She appears well-developed and well-nourished. No distress.  HENT:  Head: Normocephalic and atraumatic.  Right Ear: External ear normal.  Left Ear: External ear normal.  Nose: Nose normal.  Mouth/Throat: Oropharynx is clear and moist. No oropharyngeal exudate.  Eyes: Conjunctivae are normal. Pupils are equal, round, and reactive to light. Right eye exhibits no discharge. Left eye exhibits no discharge. No scleral icterus.  Neck: Normal range of motion. Neck supple. No tracheal deviation present. No thyromegaly present.  Cardiovascular: Normal rate, regular rhythm, normal heart sounds and intact distal pulses.  Exam reveals no gallop and no friction rub.   No murmur heard. Pulmonary/Chest: Effort normal and breath sounds normal. No respiratory distress. She has no wheezes. She has no rales. She exhibits no tenderness.  Musculoskeletal: Normal  range of motion. She exhibits no edema and no tenderness.  Lymphadenopathy:    She has no cervical adenopathy.  Neurological: She is alert and oriented to person, place, and time. No cranial nerve deficit. She exhibits normal muscle tone. Coordination normal.  Skin: Skin is warm and dry. No rash noted. She is not diaphoretic. No erythema. No pallor.  Psychiatric: She has a normal mood and affect. Her behavior is normal. Judgment and thought content normal.          Assessment & Plan:

## 2012-07-29 NOTE — Assessment & Plan Note (Signed)
Blood pressure elevated today but patient has not had a history of hypertension. Will plan to have her return to clinic in one month for recheck.

## 2012-11-14 ENCOUNTER — Other Ambulatory Visit: Payer: Self-pay | Admitting: General Practice

## 2012-11-14 DIAGNOSIS — F32A Depression, unspecified: Secondary | ICD-10-CM

## 2012-11-14 DIAGNOSIS — F329 Major depressive disorder, single episode, unspecified: Secondary | ICD-10-CM

## 2012-11-14 DIAGNOSIS — E669 Obesity, unspecified: Secondary | ICD-10-CM

## 2012-11-14 MED ORDER — SERTRALINE HCL 100 MG PO TABS: 150.0000 mg | ORAL_TABLET | Freq: Every day | ORAL | Status: DC

## 2012-11-14 MED ORDER — TOPIRAMATE 25 MG PO TABS: 25.0000 mg | ORAL_TABLET | Freq: Two times a day (BID) | ORAL | Status: DC

## 2012-11-14 MED ORDER — BUPROPION HCL ER (SR) 150 MG PO TB12: 150.0000 mg | ORAL_TABLET | Freq: Two times a day (BID) | ORAL | Status: DC

## 2012-11-14 NOTE — Telephone Encounter (Signed)
ZOLOFT refill request. Med last filled on 11/05/11. Pt last seen on 07/29/12. Ok to refill?

## 2012-11-14 NOTE — Telephone Encounter (Signed)
Fine to fill x 1 month. Needs follow up

## 2012-11-14 NOTE — Telephone Encounter (Signed)
Med called in

## 2012-11-14 NOTE — Telephone Encounter (Signed)
Meds filled with new Mail order company.

## 2012-11-23 ENCOUNTER — Other Ambulatory Visit: Payer: Self-pay | Admitting: General Practice

## 2012-11-23 DIAGNOSIS — E669 Obesity, unspecified: Secondary | ICD-10-CM

## 2012-11-23 MED ORDER — TOPIRAMATE 25 MG PO TABS: 25.0000 mg | ORAL_TABLET | Freq: Two times a day (BID) | ORAL | Status: DC

## 2012-11-23 NOTE — Telephone Encounter (Signed)
Med filled.  

## 2012-11-28 ENCOUNTER — Telehealth: Payer: Self-pay | Admitting: General Practice

## 2012-11-28 NOTE — Telephone Encounter (Signed)
Pt called stating that she is unable to receive her medications from [facility]. Per Dr. Dan Humphreys this pt will need an appt to discuss other medication options.

## 2012-11-29 ENCOUNTER — Other Ambulatory Visit: Payer: Self-pay | Admitting: General Practice

## 2012-11-29 DIAGNOSIS — F329 Major depressive disorder, single episode, unspecified: Secondary | ICD-10-CM

## 2012-11-29 DIAGNOSIS — F32A Depression, unspecified: Secondary | ICD-10-CM

## 2012-11-29 MED ORDER — BUPROPION HCL ER (SR) 150 MG PO TB12: 150.0000 mg | ORAL_TABLET | Freq: Two times a day (BID) | ORAL | Status: DC

## 2012-11-29 NOTE — Telephone Encounter (Signed)
Meds filled until pt can make appt on 12/18

## 2012-11-29 NOTE — Telephone Encounter (Signed)
Left message for pt to call back and schedule appt °

## 2012-12-07 ENCOUNTER — Ambulatory Visit (INDEPENDENT_AMBULATORY_CARE_PROVIDER_SITE_OTHER): Payer: BC Managed Care – PPO | Admitting: Internal Medicine

## 2012-12-07 ENCOUNTER — Encounter: Payer: Self-pay | Admitting: Internal Medicine

## 2012-12-07 VITALS — BP 124/80 | HR 90 | Temp 98.3°F | Resp 16 | Wt 262.8 lb

## 2012-12-07 DIAGNOSIS — F329 Major depressive disorder, single episode, unspecified: Secondary | ICD-10-CM

## 2012-12-07 DIAGNOSIS — F32A Depression, unspecified: Secondary | ICD-10-CM

## 2012-12-07 DIAGNOSIS — E669 Obesity, unspecified: Secondary | ICD-10-CM

## 2012-12-07 MED ORDER — TOPIRAMATE 50 MG PO TABS: 50.0000 mg | ORAL_TABLET | Freq: Two times a day (BID) | ORAL | Status: DC

## 2012-12-07 MED ORDER — BUPROPION HCL ER (SR) 150 MG PO TB12: 150.0000 mg | ORAL_TABLET | Freq: Two times a day (BID) | ORAL | Status: DC

## 2012-12-07 NOTE — Assessment & Plan Note (Addendum)
Wt Readings from Last 3 Encounters:  12/07/12 262 lb 12 oz (119.183 kg)  07/29/12 264 lb 8 oz (119.976 kg)  05/09/12 261 lb 8 oz (118.616 kg)     No improvement in appetite with use of Topamax 25 mg twice daily. Will increase to 50 mg twice daily. Encouraged efforts at healthy diet, such as Mediterranean style diet low in saturated fat and high in fiber. Encourage setting goal of exercise 40 minutes 3 times per week. Followup in one month.

## 2012-12-07 NOTE — Assessment & Plan Note (Signed)
Symptoms of depression well-controlled on sertraline and Wellbutrin. Will continue. Refills given today.

## 2012-12-07 NOTE — Progress Notes (Signed)
Subjective:    Patient ID: Taylor Wagner, female    DOB: 1976/10/27, 36 y.o.   MRN: [id number]  HPI 36 year old female with history of anxiety/depression and obesity presents for followup. In the interim since her last visit, she was started on Topamax. She has been taking 25 mg twice daily. Goal was for appetite suppression. She reports no notable difference in her appetite on this medication. She has not been able to lose weight. She is not currently following a specific diet plan or exercise program. Time for exercise is limited by responsibilities caring for her children. In regards to anxiety and depression, she reports symptoms are well controlled with current medications.  Outpatient Encounter Prescriptions as of 12/07/2012  Medication Sig Dispense Refill  . buPROPion (WELLBUTRIN SR) 150 MG 12 hr tablet Take 1 tablet (150 mg total) by mouth 2 (two) times daily.  60 tablet  6  . Calcium Carbonate-Vitamin D (CALCIUM + D PO) Take by mouth daily.      . Cholecalciferol (VITAMIN D-3 PO) Take by mouth daily.      Marland Kitchen EPINEPHrine (EPIPEN) 0.3 mg/0.3 mL DEVI Inject 0.3 mLs (0.3 mg total) into the muscle once.  2 Device  2  . Loratadine 10 MG CAPS Take 1 capsule by mouth daily.        . Multiple Vitamin (MULTIVITAMIN) tablet Take 1 tablet by mouth daily.        . sertraline (ZOLOFT) 100 MG tablet Take 1.5 tablets (150 mg total) by mouth daily.  45 tablet  0  . topiramate (TOPAMAX) 50 MG tablet Take 1 tablet (50 mg total) by mouth 2 (two) times daily.  180 tablet  3  . [DISCONTINUED] buPROPion (WELLBUTRIN SR) 150 MG 12 hr tablet Take 1 tablet (150 mg total) by mouth 2 (two) times daily.  16 tablet  0  . [DISCONTINUED] topiramate (TOPAMAX) 25 MG tablet Take 1 tablet (25 mg total) by mouth 2 (two) times daily.  180 tablet  3  . buPROPion (WELLBUTRIN SR) 150 MG 12 hr tablet Take 1 tablet (150 mg total) by mouth 2 (two) times daily.  180 tablet  4   BP 124/80  Pulse 90  Temp 98.3 F (36.8 C) (Oral)   Resp 16  Wt 262 lb 12 oz (119.183 kg)  LMP 11/23/2012  Review of Systems  Constitutional: Negative for fever, chills, appetite change, fatigue and unexpected weight change.  HENT: Negative for ear pain, congestion, sore throat, trouble swallowing, neck pain, voice change and sinus pressure.   Eyes: Negative for visual disturbance.  Respiratory: Negative for cough, shortness of breath, wheezing and stridor.   Cardiovascular: Negative for chest pain, palpitations and leg swelling.  Gastrointestinal: Negative for nausea, vomiting, abdominal pain, diarrhea, constipation, blood in stool, abdominal distention and anal bleeding.  Genitourinary: Negative for dysuria and flank pain.  Musculoskeletal: Negative for myalgias, arthralgias and gait problem.  Skin: Negative for color change and rash.  Neurological: Negative for dizziness and headaches.  Hematological: Negative for adenopathy. Does not bruise/bleed easily.  Psychiatric/Behavioral: Negative for suicidal ideas, sleep disturbance and dysphoric mood. The patient is not nervous/anxious.        Objective:   Physical Exam  Constitutional: She is oriented to person, place, and time. She appears well-developed and well-nourished. No distress.  HENT:  Head: Normocephalic and atraumatic.  Right Ear: External ear normal.  Left Ear: External ear normal.  Nose: Nose normal.  Mouth/Throat: Oropharynx is clear and moist. No oropharyngeal exudate.  Eyes: Conjunctivae normal are normal. Pupils are equal, round, and reactive to light. Right eye exhibits no discharge. Left eye exhibits no discharge. No scleral icterus.  Neck: Normal range of motion. Neck supple. No tracheal deviation present. No thyromegaly present.  Cardiovascular: Normal rate, regular rhythm, normal heart sounds and intact distal pulses.  Exam reveals no gallop and no friction rub.   No murmur heard. Pulmonary/Chest: Effort normal and breath sounds normal. No respiratory distress.  She has no wheezes. She has no rales. She exhibits no tenderness.  Musculoskeletal: Normal range of motion. She exhibits no edema and no tenderness.  Lymphadenopathy:    She has no cervical adenopathy.  Neurological: She is alert and oriented to person, place, and time. No cranial nerve deficit. She exhibits normal muscle tone. Coordination normal.  Skin: Skin is warm and dry. No rash noted. She is not diaphoretic. No erythema. No pallor.  Psychiatric: She has a normal mood and affect. Her behavior is normal. Judgment and thought content normal.          Assessment & Plan:

## 2012-12-15 ENCOUNTER — Other Ambulatory Visit: Payer: Self-pay | Admitting: Internal Medicine

## 2012-12-15 NOTE — Telephone Encounter (Signed)
Pt is needing refill on Sertraline 100 mg. [facility] in [place] Ph: [phone] and Fax: [phone]

## 2012-12-16 NOTE — Telephone Encounter (Signed)
Med filled.  

## 2012-12-16 NOTE — Telephone Encounter (Signed)
Rx need to be filled

## 2012-12-18 ENCOUNTER — Encounter: Payer: Self-pay | Admitting: Internal Medicine

## 2012-12-19 ENCOUNTER — Other Ambulatory Visit: Payer: Self-pay | Admitting: General Practice

## 2012-12-19 MED ORDER — SERTRALINE HCL 100 MG PO TABS: 100.0000 mg | ORAL_TABLET | Freq: Every day | ORAL | Status: DC

## 2012-12-21 ENCOUNTER — Other Ambulatory Visit: Payer: Self-pay | Admitting: Internal Medicine

## 2012-12-22 ENCOUNTER — Other Ambulatory Visit: Payer: Self-pay | Admitting: Internal Medicine

## 2012-12-22 ENCOUNTER — Telehealth: Payer: Self-pay | Admitting: *Deleted

## 2012-12-22 ENCOUNTER — Encounter: Payer: Self-pay | Admitting: Internal Medicine

## 2012-12-22 DIAGNOSIS — F32A Depression, unspecified: Secondary | ICD-10-CM

## 2012-12-22 DIAGNOSIS — F329 Major depressive disorder, single episode, unspecified: Secondary | ICD-10-CM

## 2012-12-22 NOTE — Telephone Encounter (Signed)
Pt states she needs refills on wellburtrin

## 2012-12-22 NOTE — Telephone Encounter (Signed)
Pt states she needs a refill for her wellbutrin

## 2012-12-22 NOTE — Telephone Encounter (Signed)
error 

## 2012-12-23 ENCOUNTER — Other Ambulatory Visit: Payer: Self-pay | Admitting: Internal Medicine

## 2012-12-23 MED ORDER — BUPROPION HCL ER (SR) 150 MG PO TB12: 150.0000 mg | ORAL_TABLET | Freq: Two times a day (BID) | ORAL | Status: DC

## 2012-12-23 NOTE — Telephone Encounter (Signed)
Please call patient. And ask her how many wellburtin are requested.  The refill said 16, which is odd.  Her e amil mentions mail order which would indicate 180 so that is what i have printed

## 2012-12-23 NOTE — Addendum Note (Signed)
Addended by: Ronna Polio A on: 12/23/2012 10:04 AM   Modules accepted: Orders

## 2012-12-24 ENCOUNTER — Encounter: Payer: Self-pay | Admitting: Internal Medicine

## 2012-12-26 ENCOUNTER — Encounter: Payer: Self-pay | Admitting: Internal Medicine

## 2012-12-28 ENCOUNTER — Other Ambulatory Visit: Payer: Self-pay | Admitting: *Deleted

## 2012-12-28 DIAGNOSIS — E669 Obesity, unspecified: Secondary | ICD-10-CM

## 2012-12-28 MED ORDER — BUPROPION HCL ER (SR) 150 MG PO TB12: 150.0000 mg | ORAL_TABLET | Freq: Two times a day (BID) | ORAL | Status: DC

## 2012-12-28 MED ORDER — TOPIRAMATE 50 MG PO TABS: 50.0000 mg | ORAL_TABLET | Freq: Two times a day (BID) | ORAL | Status: DC

## 2013-01-05 ENCOUNTER — Encounter: Payer: Self-pay | Admitting: Internal Medicine

## 2013-01-11 ENCOUNTER — Other Ambulatory Visit: Payer: Self-pay | Admitting: Internal Medicine

## 2013-01-12 ENCOUNTER — Other Ambulatory Visit: Payer: Self-pay | Admitting: *Deleted

## 2013-01-12 MED ORDER — SERTRALINE HCL 100 MG PO TABS: 100.0000 mg | ORAL_TABLET | Freq: Every day | ORAL | Status: DC

## 2013-02-04 ENCOUNTER — Other Ambulatory Visit: Payer: Self-pay

## 2013-04-11 ENCOUNTER — Encounter: Payer: Self-pay | Admitting: Internal Medicine

## 2013-04-11 MED ORDER — SERTRALINE HCL 100 MG PO TABS: 100.0000 mg | ORAL_TABLET | Freq: Every day | ORAL | Status: DC

## 2013-10-26 ENCOUNTER — Other Ambulatory Visit: Payer: Self-pay

## 2013-11-09 ENCOUNTER — Other Ambulatory Visit: Payer: Self-pay | Admitting: Internal Medicine

## 2013-11-09 NOTE — Telephone Encounter (Signed)
Ok refill 12/07/12, ok to refill?

## 2013-11-09 NOTE — Telephone Encounter (Signed)
We can refill x 1 month, then needs visit.

## 2013-12-26 ENCOUNTER — Other Ambulatory Visit: Payer: Self-pay | Admitting: *Deleted

## 2013-12-28 ENCOUNTER — Other Ambulatory Visit: Payer: Self-pay | Admitting: *Deleted

## 2013-12-28 NOTE — Telephone Encounter (Signed)
Mailed unread message to pt  

## 2014-01-10 ENCOUNTER — Encounter: Payer: Self-pay | Admitting: Internal Medicine

## 2014-01-10 MED ORDER — BUPROPION HCL ER (SR) 150 MG PO TB12: 150.0000 mg | ORAL_TABLET | Freq: Two times a day (BID) | ORAL | Status: DC

## 2014-01-20 ENCOUNTER — Emergency Department: Payer: Self-pay | Admitting: Emergency Medicine

## 2014-01-20 LAB — COMPREHENSIVE METABOLIC PANEL
ALK PHOS: 98 U/L
ANION GAP: 7 (ref 7–16)
AST: 14 U/L — AB (ref 15–37)
Albumin: 4.1 g/dL (ref 3.4–5.0)
BUN: 13 mg/dL (ref 7–18)
Bilirubin,Total: 0.2 mg/dL (ref 0.2–1.0)
CREATININE: 0.65 mg/dL (ref 0.60–1.30)
Calcium, Total: 8.9 mg/dL (ref 8.5–10.1)
Chloride: 104 mmol/L (ref 98–107)
Co2: 25 mmol/L (ref 21–32)
EGFR (Non-African Amer.): >60
GLUCOSE: 114 mg/dL — AB (ref 65–99)
OSMOLALITY: 273 (ref 275–301)
POTASSIUM: 3.7 mmol/L (ref 3.5–5.1)
SGPT (ALT): 24 U/L (ref 12–78)
Sodium: 136 mmol/L (ref 136–145)
TOTAL PROTEIN: 8 g/dL (ref 6.4–8.2)

## 2014-01-20 LAB — CBC WITH DIFFERENTIAL/PLATELET
Basophil #: 0.1 10*3/uL (ref 0.0–0.1)
Basophil %: 0.7 %
EOS PCT: 1 %
Eosinophil #: 0.2 10*3/uL (ref 0.0–0.7)
HCT: 37.6 % (ref 35.0–47.0)
HGB: 12 g/dL (ref 12.0–16.0)
Lymphocyte #: 2.2 10*3/uL (ref 1.0–3.6)
Lymphocyte %: 11.7 %
MCH: 27.1 pg (ref 26.0–34.0)
MCHC: 32 g/dL (ref 32.0–36.0)
MCV: 85 fL (ref 80–100)
MONO ABS: 0.7 x10 3/mm (ref 0.2–0.9)
MONOS PCT: 3.8 %
Neutrophil #: 15.4 10*3/uL — ABNORMAL HIGH (ref 1.4–6.5)
Neutrophil %: 82.8 %
PLATELETS: 427 10*3/uL (ref 150–440)
RBC: 4.44 10*6/uL (ref 3.80–5.20)
RDW: 14.3 % (ref 11.5–14.5)
WBC: 18.6 10*3/uL — AB (ref 3.6–11.0)

## 2014-01-20 LAB — URINALYSIS, COMPLETE
BACTERIA: NONE SEEN
BILIRUBIN, UR: NEGATIVE
Glucose,UR: NEGATIVE mg/dL (ref 0–75)
LEUKOCYTE ESTERASE: NEGATIVE
NITRITE: NEGATIVE
PH: 7 (ref 4.5–8.0)
PROTEIN: NEGATIVE
RBC,UR: 2 /HPF (ref 0–5)
SPECIFIC GRAVITY: 1.019 (ref 1.003–1.030)
Squamous Epithelial: 3
WBC UR: 3 /HPF (ref 0–5)

## 2014-01-20 LAB — LIPASE, BLOOD: LIPASE: 115 U/L (ref 73–393)

## 2014-02-05 ENCOUNTER — Encounter: Payer: Self-pay | Admitting: Internal Medicine

## 2014-02-05 ENCOUNTER — Ambulatory Visit (INDEPENDENT_AMBULATORY_CARE_PROVIDER_SITE_OTHER): Payer: BC Managed Care – PPO | Admitting: Internal Medicine

## 2014-02-05 VITALS — BP 120/78 | HR 105 | Temp 98.6°F | Wt 263.0 lb

## 2014-02-05 DIAGNOSIS — Z0000 Encounter for general adult medical examination without abnormal findings: Secondary | ICD-10-CM

## 2014-02-05 DIAGNOSIS — F329 Major depressive disorder, single episode, unspecified: Secondary | ICD-10-CM

## 2014-02-05 DIAGNOSIS — F32A Depression, unspecified: Secondary | ICD-10-CM

## 2014-02-05 DIAGNOSIS — F3289 Other specified depressive episodes: Secondary | ICD-10-CM

## 2014-02-05 LAB — CBC WITH DIFFERENTIAL/PLATELET
Basophils Absolute: 0 10*3/uL (ref 0.0–0.1)
Basophils Relative: 0.4 % (ref 0.0–3.0)
EOS ABS: 0.2 10*3/uL (ref 0.0–0.7)
Eosinophils Relative: 2.9 % (ref 0.0–5.0)
HCT: 36.1 % (ref 36.0–46.0)
Hemoglobin: 11.4 g/dL — ABNORMAL LOW (ref 12.0–15.0)
LYMPHS ABS: 2.4 10*3/uL (ref 0.7–4.0)
Lymphocytes Relative: 30.4 % (ref 12.0–46.0)
MCHC: 31.7 g/dL (ref 30.0–36.0)
MCV: 86.3 fl (ref 78.0–100.0)
MONO ABS: 0.6 10*3/uL (ref 0.1–1.0)
Monocytes Relative: 7.6 % (ref 3.0–12.0)
NEUTROS PCT: 58.7 % (ref 43.0–77.0)
Neutro Abs: 4.7 10*3/uL (ref 1.4–7.7)
PLATELETS: 403 10*3/uL — AB (ref 150.0–400.0)
RBC: 4.18 Mil/uL (ref 3.87–5.11)
RDW: 14.9 % — ABNORMAL HIGH (ref 11.5–14.6)
WBC: 8 10*3/uL (ref 4.5–10.5)

## 2014-02-05 LAB — COMPREHENSIVE METABOLIC PANEL
ALK PHOS: 74 U/L (ref 39–117)
ALT: 20 U/L (ref 0–35)
AST: 17 U/L (ref 0–37)
Albumin: 3.7 g/dL (ref 3.5–5.2)
BILIRUBIN TOTAL: 0.2 mg/dL — AB (ref 0.3–1.2)
BUN: 13 mg/dL (ref 6–23)
CALCIUM: 9.2 mg/dL (ref 8.4–10.5)
CO2: 24 mEq/L (ref 19–32)
Chloride: 105 mEq/L (ref 96–112)
Creatinine, Ser: 0.6 mg/dL (ref 0.4–1.2)
GFR: 129.86 mL/min (ref >60.00)
Glucose, Bld: 94 mg/dL (ref 70–99)
POTASSIUM: 4.3 meq/L (ref 3.5–5.1)
SODIUM: 139 meq/L (ref 135–145)
Total Protein: 7.4 g/dL (ref 6.0–8.3)

## 2014-02-05 LAB — LIPID PANEL
CHOLESTEROL: 168 mg/dL (ref 0–200)
HDL: 46.9 mg/dL (ref >39.00)
LDL Cholesterol: 100 mg/dL — ABNORMAL HIGH (ref 0–99)
Total CHOL/HDL Ratio: 4
Triglycerides: 107 mg/dL (ref 0.0–149.0)
VLDL: 21.4 mg/dL (ref 0.0–40.0)

## 2014-02-05 LAB — TSH: TSH: 1.71 u[IU]/mL (ref 0.35–5.50)

## 2014-02-05 MED ORDER — BUPROPION HCL ER (SR) 150 MG PO TB12: 150.0000 mg | ORAL_TABLET | Freq: Two times a day (BID) | ORAL | Status: DC

## 2014-02-05 MED ORDER — SERTRALINE HCL 100 MG PO TABS: 100.0000 mg | ORAL_TABLET | Freq: Every day | ORAL | Status: DC

## 2014-02-05 NOTE — Assessment & Plan Note (Signed)
Symptoms well controlled on current medications. Will continue. 

## 2014-02-05 NOTE — Progress Notes (Signed)
Pre-visit discussion using our clinic review tool. No additional management support is needed unless otherwise documented below in the visit note.  

## 2014-02-05 NOTE — Progress Notes (Signed)
Subjective:    Patient ID: Taylor Wagner, female    DOB: 10/08/1976, 38 y.o.   MRN: [id number]  HPI 38YO female with depression presents for follow up. She presents to clinic today with her 3 children, who are out of school for inclement weather. She reports she is feeling well. Symptoms of depression well controlled on current medications. No new concerns today.   She notes that 3 weeks ago, she was evaluated in the [facility] ED for severe abdominal pain. CT abdomen and labs reportedly normal. Symptoms of abdominal pain resolved within 24hr and have not recurred. Tolerating full diet. No nausea, change in appetite, change in bowel habits.  Review of Systems  Constitutional: Negative for fever, chills, appetite change, fatigue and unexpected weight change.  HENT: Negative for congestion, ear pain, sinus pressure, sore throat, trouble swallowing and voice change.   Eyes: Negative for visual disturbance.  Respiratory: Negative for cough, shortness of breath, wheezing and stridor.   Cardiovascular: Negative for chest pain, palpitations and leg swelling.  Gastrointestinal: Negative for nausea, vomiting, abdominal pain, diarrhea, constipation, blood in stool, abdominal distention and anal bleeding.  Genitourinary: Negative for dysuria and flank pain.  Musculoskeletal: Negative for arthralgias, gait problem, myalgias and neck pain.  Skin: Negative for color change and rash.  Neurological: Negative for dizziness and headaches.  Hematological: Negative for adenopathy. Does not bruise/bleed easily.  Psychiatric/Behavioral: Negative for suicidal ideas, sleep disturbance and dysphoric mood. The patient is not nervous/anxious.        Objective:    BP 120/78  Pulse 105  Temp(Src) 98.6 F (37 C) (Oral)  Wt 263 lb (119.296 kg)  SpO2 98%  LMP 01/23/2014 Physical Exam  Constitutional: She is oriented to person, place, and time. She appears well-developed and well-nourished. No distress.    HENT:  Head: Normocephalic and atraumatic.  Right Ear: External ear normal.  Left Ear: External ear normal.  Nose: Nose normal.  Mouth/Throat: Oropharynx is clear and moist. No oropharyngeal exudate.  Eyes: Conjunctivae are normal. Pupils are equal, round, and reactive to light. Right eye exhibits no discharge. Left eye exhibits no discharge. No scleral icterus.  Neck: Normal range of motion. Neck supple. No tracheal deviation present. No thyromegaly present.  Cardiovascular: Normal rate, regular rhythm, normal heart sounds and intact distal pulses.  Exam reveals no gallop and no friction rub.   No murmur heard. Pulmonary/Chest: Effort normal and breath sounds normal. No accessory muscle usage. Not tachypneic. No respiratory distress. She has no decreased breath sounds. She has no wheezes. She has no rhonchi. She has no rales. She exhibits no tenderness.  Musculoskeletal: Normal range of motion. She exhibits no edema and no tenderness.  Lymphadenopathy:    She has no cervical adenopathy.  Neurological: She is alert and oriented to person, place, and time. No cranial nerve deficit. She exhibits normal muscle tone. Coordination normal.  Skin: Skin is warm and dry. No rash noted. She is not diaphoretic. No erythema. No pallor.  Psychiatric: She has a normal mood and affect. Her behavior is normal. Judgment and thought content normal.          Assessment & Plan:   Problem List Items Addressed This Visit   Depression - Primary     Symptoms well controlled on current medications. Will continue.    Relevant Medications      sertraline (ZOLOFT) tablet      buPROPion (WELLBUTRIN SR) 12 hr tablet    Other Visit Diagnoses  Routine general medical examination at a health care facility        Relevant Orders       CBC with Differential (Completed)       Comprehensive metabolic panel (Completed)       Lipid panel (Completed)       Vit D  25 hydroxy (rtn osteoporosis monitoring)       TSH  (Completed)        Return in about 4 weeks (around 03/05/2014) for Physical.

## 2014-02-06 LAB — VITAMIN D 25 HYDROXY (VIT D DEFICIENCY, FRACTURES): VIT D 25 HYDROXY: 26 ng/mL — AB (ref 30–89)

## 2014-02-07 ENCOUNTER — Other Ambulatory Visit: Payer: Self-pay | Admitting: Internal Medicine

## 2014-02-12 ENCOUNTER — Encounter: Payer: Self-pay | Admitting: Internal Medicine

## 2014-02-13 ENCOUNTER — Other Ambulatory Visit: Payer: Self-pay | Admitting: Internal Medicine

## 2015-01-23 ENCOUNTER — Other Ambulatory Visit: Payer: Self-pay | Admitting: Internal Medicine

## 2015-01-31 ENCOUNTER — Encounter: Payer: Self-pay | Admitting: Internal Medicine

## 2015-02-05 ENCOUNTER — Encounter: Payer: Self-pay | Admitting: Internal Medicine

## 2015-02-07 ENCOUNTER — Encounter: Payer: Self-pay | Admitting: Internal Medicine

## 2015-02-07 DIAGNOSIS — F329 Major depressive disorder, single episode, unspecified: Secondary | ICD-10-CM

## 2015-02-07 DIAGNOSIS — F32A Depression, unspecified: Secondary | ICD-10-CM

## 2015-02-07 MED ORDER — BUPROPION HCL ER (SR) 150 MG PO TB12: 150.0000 mg | ORAL_TABLET | Freq: Two times a day (BID) | ORAL | Status: DC

## 2015-02-07 MED ORDER — SERTRALINE HCL 100 MG PO TABS: 100.0000 mg | ORAL_TABLET | Freq: Every day | ORAL | Status: DC

## 2015-02-07 NOTE — Telephone Encounter (Signed)
Appt 02/26/15

## 2015-02-26 ENCOUNTER — Encounter: Payer: Self-pay | Admitting: Internal Medicine

## 2015-02-26 ENCOUNTER — Ambulatory Visit (INDEPENDENT_AMBULATORY_CARE_PROVIDER_SITE_OTHER): Payer: BLUE CROSS/BLUE SHIELD | Admitting: Internal Medicine

## 2015-02-26 VITALS — BP 131/86 | HR 91 | Temp 98.4°F | Ht 64.75 in | Wt 257.2 lb

## 2015-02-26 DIAGNOSIS — Z91018 Allergy to other foods: Secondary | ICD-10-CM

## 2015-02-26 DIAGNOSIS — F32A Depression, unspecified: Secondary | ICD-10-CM

## 2015-02-26 DIAGNOSIS — Z0000 Encounter for general adult medical examination without abnormal findings: Secondary | ICD-10-CM

## 2015-02-26 DIAGNOSIS — F329 Major depressive disorder, single episode, unspecified: Secondary | ICD-10-CM

## 2015-02-26 MED ORDER — BUPROPION HCL ER (SR) 150 MG PO TB12: 150.0000 mg | ORAL_TABLET | Freq: Two times a day (BID) | ORAL | Status: DC

## 2015-02-26 MED ORDER — SERTRALINE HCL 100 MG PO TABS: 100.0000 mg | ORAL_TABLET | Freq: Every day | ORAL | Status: DC

## 2015-02-26 NOTE — Progress Notes (Signed)
Subjective:    Patient ID: Taylor Wagner, female    DOB: 12-Mar-1976, 39 y.o.   MRN: [id number]  HPI  38YO female presents for follow up.  Last seen 02/05/2014 for depression.  Went to see Allergy/Immunology. No conclusive findings per her report. Feels that she may have sensitivity to meat from tick bite. Symptoms have resolved with eliminating meat from diet. Now eats only chicken or fish. If any meat consumption, has hives and nausea.  Depression symptoms have been well controlled with current medication.  Past medical, surgical, family and social history per today's encounter.  Review of Systems  Constitutional: Negative for fever, chills, appetite change, fatigue and unexpected weight change.  Eyes: Negative for visual disturbance.  Respiratory: Negative for shortness of breath.   Cardiovascular: Negative for chest pain and leg swelling.  Gastrointestinal: Negative for nausea, vomiting, abdominal pain, diarrhea and constipation.  Musculoskeletal: Negative for myalgias and arthralgias.  Skin: Negative for color change and rash.  Hematological: Negative for adenopathy. Does not bruise/bleed easily.  Psychiatric/Behavioral: Negative for suicidal ideas, sleep disturbance and dysphoric mood. The patient is not nervous/anxious.        Objective:    BP 131/86 mmHg  Pulse 91  Temp(Src) 98.4 F (36.9 C) (Oral)  Ht 5' 4.75" (1.645 m)  Wt 257 lb 4 oz (116.688 kg)  BMI 43.12 kg/m2  SpO2 98%  LMP 02/15/2015 Physical Exam  Constitutional: She is oriented to person, place, and time. She appears well-developed and well-nourished. No distress.  HENT:  Head: Normocephalic and atraumatic.  Right Ear: External ear normal.  Left Ear: External ear normal.  Nose: Nose normal.  Mouth/Throat: Oropharynx is clear and moist. No oropharyngeal exudate.  Eyes: Conjunctivae are normal. Pupils are equal, round, and reactive to light. Right eye exhibits no discharge. Left eye exhibits no  discharge. No scleral icterus.  Neck: Normal range of motion. Neck supple. No tracheal deviation present. No thyromegaly present.  Cardiovascular: Normal rate, regular rhythm, normal heart sounds and intact distal pulses.  Exam reveals no gallop and no friction rub.   No murmur heard. Pulmonary/Chest: Effort normal and breath sounds normal. No respiratory distress. She has no wheezes. She has no rales. She exhibits no tenderness.  Musculoskeletal: Normal range of motion. She exhibits no edema or tenderness.  Lymphadenopathy:    She has no cervical adenopathy.  Neurological: She is alert and oriented to person, place, and time. No cranial nerve deficit. She exhibits normal muscle tone. Coordination normal.  Skin: Skin is warm and dry. No rash noted. She is not diaphoretic. No erythema. No pallor.  Psychiatric: She has a normal mood and affect. Her behavior is normal. Judgment and thought content normal.          Assessment & Plan:   Problem List Items Addressed This Visit      Unprioritized   Allergy to meat    Symptoms are consistent with meat allergy. Discussed limitations to antibody testing, however will add this to labs next week. Encouraged her to continue avoid meat products. Discussed referral back to allergy/immunology, but will hold off for now.      Relevant Orders   Galactose-alpha-1,3-galactose IgE   Depression - Primary    Symptoms well controlled with Wellbutrin and Sertraline. Will continue.      Relevant Medications   buPROPion (WELLBUTRIN SR) 12 hr tablet   sertraline (ZOLOFT) tablet    Other Visit Diagnoses    Routine general medical examination at a health  care facility        Relevant Orders    CBC with Differential/Platelet    Comprehensive metabolic panel    Lipid panel    Microalbumin / creatinine urine ratio    Vit D  25 hydroxy (rtn osteoporosis monitoring)    TSH    Hemoglobin A1c        Return in about 6 months (around 08/29/2015) for  Physical.

## 2015-02-26 NOTE — Assessment & Plan Note (Signed)
Symptoms well controlled with Wellbutrin and Sertraline. Will continue.

## 2015-02-26 NOTE — Patient Instructions (Signed)
Labs fasting next week.  Follow up in 08/2015 for physical

## 2015-02-26 NOTE — Progress Notes (Signed)
Pre visit review using our clinic review tool, if applicable. No additional management support is needed unless otherwise documented below in the visit note. 

## 2015-02-26 NOTE — Assessment & Plan Note (Signed)
Symptoms are consistent with meat allergy. Discussed limitations to antibody testing, however will add this to labs next week. Encouraged her to continue avoid meat products. Discussed referral back to allergy/immunology, but will hold off for now.

## 2015-03-08 ENCOUNTER — Encounter: Payer: Self-pay | Admitting: Internal Medicine

## 2016-01-19 ENCOUNTER — Other Ambulatory Visit: Payer: Self-pay | Admitting: Internal Medicine

## 2016-04-13 ENCOUNTER — Other Ambulatory Visit: Payer: Self-pay | Admitting: Internal Medicine

## 2016-05-27 ENCOUNTER — Ambulatory Visit (INDEPENDENT_AMBULATORY_CARE_PROVIDER_SITE_OTHER): Payer: BLUE CROSS/BLUE SHIELD | Admitting: Internal Medicine

## 2016-05-27 ENCOUNTER — Encounter: Payer: Self-pay | Admitting: Internal Medicine

## 2016-05-27 VITALS — BP 138/94 | HR 91 | Ht 64.0 in | Wt 262.0 lb

## 2016-05-27 DIAGNOSIS — F32A Depression, unspecified: Secondary | ICD-10-CM

## 2016-05-27 DIAGNOSIS — F411 Generalized anxiety disorder: Secondary | ICD-10-CM | POA: Diagnosis not present

## 2016-05-27 DIAGNOSIS — F329 Major depressive disorder, single episode, unspecified: Secondary | ICD-10-CM

## 2016-05-27 DIAGNOSIS — E669 Obesity, unspecified: Secondary | ICD-10-CM | POA: Diagnosis not present

## 2016-05-27 MED ORDER — BUPROPION HCL ER (SR) 200 MG PO TB12: 200.0000 mg | ORAL_TABLET | Freq: Two times a day (BID) | ORAL | Status: DC

## 2016-05-27 NOTE — Patient Instructions (Signed)
Increase Wellbutrin to 200mg  twice daily.  Continue Sertraline.  Email in 1-2 weeks.

## 2016-05-27 NOTE — Progress Notes (Signed)
Subjective:    Patient ID: Taylor Wagner, female    DOB: 23-Jul-1976, 40 y.o.   MRN: [id number]  HPI  40YO female presents for follow up. Last seen in 02/2015 for depression.  Notes more anxiety recently. Out of proportion to situation. Working full time. Caring for 3 children. Symptoms of depression have been well controlled. Denies depressed mood.    Wt Readings from Last 3 Encounters:  05/27/16 262 lb (118.842 kg)  02/26/15 257 lb 4 oz (116.688 kg)  02/05/14 263 lb (119.296 kg)   BP Readings from Last 3 Encounters:  05/27/16 138/94  02/26/15 131/86  02/05/14 120/78    Past Medical History  Diagnosis Date  . Allergy   . ADD (attention deficit disorder with hyperactivity)   . Depression    Family History  Problem Relation Age of Onset  . Stroke Father   . Stroke Maternal Aunt   . Diabetes Maternal Aunt   . Stroke Maternal Grandfather   . Stroke Paternal Grandfather    Past Surgical History  Procedure Laterality Date  . Cesarean section      x3  . Foot surgery      pin removal  . Tubal ligation     Social History   Social History  . Marital Status: Married    Spouse Name: N/A  . Number of Children: N/A  . Years of Education: N/A   Social History Main Topics  . Smoking status: Former Games developer  . Smokeless tobacco: Never Used  . Alcohol Use: Yes     Comment: occasional  . Drug Use: No  . Sexual Activity: Not Asked   Other Topics Concern  . 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    Review of Systems  Constitutional: Negative for fever, chills, appetite change, fatigue and unexpected weight change.  Eyes: Negative for visual disturbance.  Respiratory: Negative for shortness of breath.   Cardiovascular: Negative for chest pain and leg swelling.  Gastrointestinal: Negative for nausea, vomiting, abdominal pain, diarrhea and constipation.  Skin: Negative for color change and rash.  Hematological: Negative for  adenopathy. Does not bruise/bleed easily.  Psychiatric/Behavioral: Negative for sleep disturbance and dysphoric mood. The patient is nervous/anxious.        Objective:    BP 138/94 mmHg  Pulse 91  Ht  (1.626 m)  Wt 262 lb (118.842 kg)  BMI 44.95 kg/m2  SpO2 98%  LMP 05/13/2016 Physical Exam  Constitutional: She is oriented to person, place, and time. She appears well-developed and well-nourished. No distress.  HENT:  Head: Normocephalic and atraumatic.  Right Ear: External ear normal.  Left Ear: External ear normal.  Nose: Nose normal.  Mouth/Throat: Oropharynx is clear and moist. No oropharyngeal exudate.  Eyes: Conjunctivae are normal. Pupils are equal, round, and reactive to light. Right eye exhibits no discharge. Left eye exhibits no discharge. No scleral icterus.  Neck: Normal range of motion. Neck supple. No tracheal deviation present. No thyromegaly present.  Cardiovascular: Normal rate, regular rhythm, normal heart sounds and intact distal pulses.  Exam reveals no gallop and no friction rub.   No murmur heard. Pulmonary/Chest: Effort normal and breath sounds normal. No respiratory distress. She has no wheezes. She has no rales. She exhibits no tenderness.  Musculoskeletal: Normal range of motion. She exhibits no edema or tenderness.  Lymphadenopathy:    She has no cervical adenopathy.  Neurological: She is alert and oriented to person, place, and  time. No cranial nerve deficit. She exhibits normal muscle tone. Coordination normal.  Skin: Skin is warm and dry. No rash noted. She is not diaphoretic. No erythema. No pallor.  Psychiatric: Her speech is normal and behavior is normal. Judgment and thought content normal. Her mood appears anxious.          Assessment & Plan:   Problem List Items Addressed This Visit      Unprioritized   Anxiety state    Symptoms of anxiety worsening. Discussed several options for medications. Encouraged her to take time for herself.  Encouraged exercise. Will increase Wellbutrin to 400mg  daily. Email in 1-2 weeks. Follow up in 4 weeks.      Relevant Medications   buPROPion (WELLBUTRIN SR) 200 MG 12 hr tablet   Depression - Primary    Symptoms well controlled on Sertraline. Will continue.      Relevant Medications   buPROPion (WELLBUTRIN SR) 200 MG 12 hr tablet   Obesity    Wt Readings from Last 3 Encounters:  05/27/16 262 lb (118.842 kg)  02/26/15 257 lb 4 oz (116.688 kg)  02/05/14 263 lb (119.296 kg)   Body mass index is 44.95 kg/(m^2). Discussed some options for appetite suppression. She will look into coverage for Saxenda          Return in about 4 weeks (around 06/24/2016) for Recheck.  Ronna PolioJennifer Alexios Keown, MD Internal Medicine [facility]

## 2016-05-27 NOTE — Assessment & Plan Note (Signed)
Wt Readings from Last 3 Encounters:  05/27/16 262 lb (118.842 kg)  02/26/15 257 lb 4 oz (116.688 kg)  02/05/14 263 lb (119.296 kg)   Body mass index is 44.95 kg/(m^2). Discussed some options for appetite suppression. She will look into coverage for Saxenda

## 2016-05-27 NOTE — Assessment & Plan Note (Signed)
Symptoms well controlled on Sertraline. Will continue. 

## 2016-05-27 NOTE — Assessment & Plan Note (Signed)
Symptoms of anxiety worsening. Discussed several options for medications. Encouraged her to take time for herself. Encouraged exercise. Will increase Wellbutrin to 400mg  daily. Email in 1-2 weeks. Follow up in 4 weeks.

## 2016-06-13 ENCOUNTER — Encounter: Payer: Self-pay | Admitting: Internal Medicine

## 2016-07-09 ENCOUNTER — Ambulatory Visit: Payer: BLUE CROSS/BLUE SHIELD | Admitting: Internal Medicine

## 2016-07-09 DIAGNOSIS — Z0289 Encounter for other administrative examinations: Secondary | ICD-10-CM

## 2016-07-18 ENCOUNTER — Other Ambulatory Visit: Payer: Self-pay | Admitting: Internal Medicine

## 2017-07-27 ENCOUNTER — Encounter: Payer: Self-pay | Admitting: Family

## 2017-07-27 ENCOUNTER — Ambulatory Visit (INDEPENDENT_AMBULATORY_CARE_PROVIDER_SITE_OTHER): Payer: BLUE CROSS/BLUE SHIELD | Admitting: Family

## 2017-07-27 VITALS — BP 135/85 | HR 100 | Temp 98.4°F | Resp 17 | Ht 64.0 in | Wt 243.6 lb

## 2017-07-27 DIAGNOSIS — F419 Anxiety disorder, unspecified: Secondary | ICD-10-CM

## 2017-07-27 DIAGNOSIS — F329 Major depressive disorder, single episode, unspecified: Secondary | ICD-10-CM | POA: Diagnosis not present

## 2017-07-27 DIAGNOSIS — F32A Depression, unspecified: Secondary | ICD-10-CM

## 2017-07-27 MED ORDER — SERTRALINE HCL 100 MG PO TABS: 100.0000 mg | ORAL_TABLET | Freq: Every day | ORAL | 3 refills | Status: DC

## 2017-07-27 MED ORDER — BUPROPION HCL ER (XL) 300 MG PO TB24: 300.0000 mg | ORAL_TABLET | Freq: Every day | ORAL | 2 refills | Status: DC

## 2017-07-27 NOTE — Progress Notes (Signed)
Subjective:    Patient ID: Taylor Wagner, female    DOB: 10-Jan-1976, 41 y.o.   MRN: [id number]  CC: Taylor Wagner is a 41 y.o. female who presents today for follow up.   HPI: Depression and anxiety- worsening of late; had run out of wellbutrin and started using older dose and having more anxiety and depression. Work is stressful.  No thoughts of hurting herself or anyone else.     HISTORY:  Past Medical History:  Diagnosis Date  . ADD (attention deficit disorder with hyperactivity)   . Allergy   . Depression    Past Surgical History:  Procedure Laterality Date  . CESAREAN SECTION     x3  . FOOT SURGERY     pin removal  . TUBAL LIGATION     Family History  Problem Relation Age of Onset  . Stroke Father   . Stroke Maternal Aunt   . Diabetes Maternal Aunt   . Stroke Maternal Grandfather   . Stroke Paternal Grandfather     Allergies: Beef-derived products; Erythromycin; and Pork-derived products Current Outpatient Prescriptions on File Prior to Visit  Medication Sig Dispense Refill  . EPINEPHrine (EPIPEN) 0.3 mg/0.3 mL DEVI Inject 0.3 mLs (0.3 mg total) into the muscle once. 2 Device 2   No current facility-administered medications on file prior to visit.     Social History  Substance Use Topics  . Smoking status: Former Games developer  . Smokeless tobacco: Never Used  . Alcohol use Yes     Comment: occasional    Review of Systems  Constitutional: Negative for chills and fever.  Respiratory: Negative for cough.   Cardiovascular: Negative for chest pain and palpitations.  Gastrointestinal: Negative for nausea and vomiting.  Psychiatric/Behavioral: Negative for suicidal ideas. The patient is nervous/anxious.       Objective:    BP 135/85   Pulse 100   Temp 98.4 F (36.9 C) (Oral)   Resp 17   Ht 5\' 4"  (1.626 m)   Wt 243 lb 9.6 oz (110.5 kg)   SpO2 98%   BMI 41.81 kg/m  BP Readings from Last 3 Encounters:  07/27/17 135/85  05/27/16 (!)  138/94  02/26/15 131/86   Wt Readings from Last 3 Encounters:  07/27/17 243 lb 9.6 oz (110.5 kg)  05/27/16 262 lb (118.8 kg)  02/26/15 257 lb 4 oz (116.7 kg)    Physical Exam  Constitutional: She appears well-developed and well-nourished.  Eyes: Conjunctivae are normal.  Cardiovascular: Normal rate, regular rhythm, normal heart sounds and normal pulses.   Pulmonary/Chest: Effort normal and breath sounds normal. She has no wheezes. She has no rhonchi. She has no rales.  Neurological: She is alert.  Skin: Skin is warm and dry.  Psychiatric: She has a normal mood and affect. Her speech is normal and behavior is normal. Thought content normal.  Vitals reviewed.      Assessment & Plan:   Problem List Items Addressed This Visit      Other   Anxiety and depression - Primary    Worsening. Will put patient back on wellbutrin dose 300mg  qam XL ( verus SR). Refilled zoloft as well. Follow up in 6-8 weeks for CPE, pap, labs, and see how regimen is doing.       Relevant Medications   buPROPion (WELLBUTRIN XL) 300 MG 24 hr tablet   sertraline (ZOLOFT) 100 MG tablet   RESOLVED: Anxiety state   Relevant Medications   buPROPion (WELLBUTRIN XL) 300  MG 24 hr tablet   sertraline (ZOLOFT) 100 MG tablet       I have discontinued Ms. Kakos's buPROPion. I have also changed her sertraline. Additionally, I am having her start on buPROPion. Lastly, I am having her maintain her EPINEPHrine.   Meds ordered this encounter  Medications  . buPROPion (WELLBUTRIN XL) 300 MG 24 hr tablet    Sig: Take 1 tablet (300 mg total) by mouth daily.    Dispense:  90 tablet    Refill:  2    Order Specific Question:   Supervising Provider    Answer:   Duncan DullULLO, TERESA L [[id number]]  . sertraline (ZOLOFT) 100 MG tablet    Sig: Take 1 tablet (100 mg total) by mouth daily.    Dispense:  90 tablet    Refill:  3    Order Specific Question:   Supervising Provider    Answer:   Sherlene ShamsULLO, TERESA L [[id number]]    Return  precautions given.   Risks, benefits, and alternatives of the medications and treatment plan prescribed today were discussed, and patient expressed understanding.   Education regarding symptom management and diagnosis given to patient on AVS.  Continue to follow with Allegra GranaArnett, Dezman Granda G, FNP for routine health maintenance.   Taylor SackKimberly McAdams Colocho and I agreed with plan.   Rennie PlowmanMargaret Amika Tassin, FNP

## 2017-07-27 NOTE — Assessment & Plan Note (Signed)
Worsening. Will put patient back on wellbutrin dose 300mg  qam XL ( verus SR). Refilled zoloft as well. Follow up in 6-8 weeks for CPE, pap, labs, and see how regimen is doing.

## 2017-07-27 NOTE — Patient Instructions (Addendum)
Come back for physical and fasting labs Monitor blood pressure- goal is less than 130/80. If continues to be high, we will consider medication at follow up.   Let me  Know how you are doing on medication regimen.    Managing Your Hypertension Hypertension is commonly called high blood pressure. This is when the force of your blood pressing against the walls of your arteries is too strong. Arteries are blood vessels that carry blood from your heart throughout your body. Hypertension forces the heart to work harder to pump blood, and may cause the arteries to become narrow or stiff. Having untreated or uncontrolled hypertension can cause heart attack, stroke, kidney disease, and other problems. What are blood pressure readings? A blood pressure reading consists of a higher number over a lower number. Ideally, your blood pressure should be below 120/80. The first ("top") number is called the systolic pressure. It is a measure of the pressure in your arteries as your heart beats. The second ("bottom") number is called the diastolic pressure. It is a measure of the pressure in your arteries as the heart relaxes. What does my blood pressure reading mean? Blood pressure is classified into four stages. Based on your blood pressure reading, your health care provider may use the following stages to determine what type of treatment you need, if any. Systolic pressure and diastolic pressure are measured in a unit called mm Hg. Normal  Systolic pressure: below 120.  Diastolic pressure: below 80. Elevated  Systolic pressure: 120-129.  Diastolic pressure: below 80. Hypertension stage 1  Systolic pressure: 130-139.  Diastolic pressure: 80-89. Hypertension stage 2  Systolic pressure: 140 or above.  Diastolic pressure: 90 or above. What health risks are associated with hypertension? Managing your hypertension is an important responsibility. Uncontrolled hypertension can lead to:  A heart  attack.  A stroke.  A weakened blood vessel (aneurysm).  Heart failure.  Kidney damage.  Eye damage.  Metabolic syndrome.  Memory and concentration problems.  What changes can I make to manage my hypertension? Hypertension can be managed by making lifestyle changes and possibly by taking medicines. Your health care provider will help you make a plan to bring your blood pressure within a normal range. Eating and drinking  Eat a diet that is high in fiber and potassium, and low in salt (sodium), added sugar, and fat. An example eating plan is called the DASH (Dietary Approaches to Stop Hypertension) diet. To eat this way: ? Eat plenty of fresh fruits and vegetables. Try to fill half of your plate at each meal with fruits and vegetables. ? Eat whole grains, such as whole wheat pasta, brown rice, or whole grain bread. Fill about one quarter of your plate with whole grains. ? Eat low-fat diary products. ? Avoid fatty cuts of meat, processed or cured meats, and poultry with skin. Fill about one quarter of your plate with lean proteins such as fish, chicken without skin, beans, eggs, and tofu. ? Avoid premade and processed foods. These tend to be higher in sodium, added sugar, and fat.  Reduce your daily sodium intake. Most people with hypertension should eat less than 1,500 mg of sodium a day.  Limit alcohol intake to no more than 1 drink a day for nonpregnant women and 2 drinks a day for men. One drink equals 12 oz of beer, 5 oz of wine, or 1 oz of hard liquor. Lifestyle  Work with your health care provider to maintain a healthy body weight, or  to lose weight. Ask what an ideal weight is for you.  Get at least 30 minutes of exercise that causes your heart to beat faster (aerobic exercise) most days of the week. Activities may include walking, swimming, or biking.  Include exercise to strengthen your muscles (resistance exercise), such as weight lifting, as part of your weekly exercise  routine. Try to do these types of exercises for 30 minutes at least 3 days a week.  Do not use any products that contain nicotine or tobacco, such as cigarettes and e-cigarettes. If you need help quitting, ask your health care provider.  Control any long-term (chronic) conditions you have, such as high cholesterol or diabetes. Monitoring  Monitor your blood pressure at home as told by your health care provider. Your personal target blood pressure may vary depending on your medical conditions, your age, and other factors.  Have your blood pressure checked regularly, as often as told by your health care provider. Working with your health care provider  Review all the medicines you take with your health care provider because there may be side effects or interactions.  Talk with your health care provider about your diet, exercise habits, and other lifestyle factors that may be contributing to hypertension.  Visit your health care provider regularly. Your health care provider can help you create and adjust your plan for managing hypertension. Will I need medicine to control my blood pressure? Your health care provider may prescribe medicine if lifestyle changes are not enough to get your blood pressure under control, and if:  Your systolic blood pressure is 130 or higher.  Your diastolic blood pressure is 80 or higher.  Take medicines only as told by your health care provider. Follow the directions carefully. Blood pressure medicines must be taken as prescribed. The medicine does not work as well when you skip doses. Skipping doses also puts you at risk for problems. Contact a health care provider if:  You think you are having a reaction to medicines you have taken.  You have repeated (recurrent) headaches.  You feel dizzy.  You have swelling in your ankles.  You have trouble with your vision. Get help right away if:  You develop a severe headache or confusion.  You have unusual  weakness or numbness, or you feel faint.  You have severe pain in your chest or abdomen.  You vomit repeatedly.  You have trouble breathing. Summary  Hypertension is when the force of blood pumping through your arteries is too strong. If this condition is not controlled, it may put you at risk for serious complications.  Your personal target blood pressure may vary depending on your medical conditions, your age, and other factors. For most people, a normal blood pressure is less than 120/80.  Hypertension is managed by lifestyle changes, medicines, or both. Lifestyle changes include weight loss, eating a healthy, low-sodium diet, exercising more, and limiting alcohol. This information is not intended to replace advice given to you by your health care provider. Make sure you discuss any questions you have with your health care provider. Document Released: 08/31/2012 Document Revised: 11/04/2016 Document Reviewed: 11/04/2016 Elsevier Interactive Patient Education  [employer]2018 [employer]

## 2017-10-28 ENCOUNTER — Encounter: Payer: Self-pay | Admitting: Family

## 2017-10-28 ENCOUNTER — Ambulatory Visit (INDEPENDENT_AMBULATORY_CARE_PROVIDER_SITE_OTHER): Payer: BLUE CROSS/BLUE SHIELD | Admitting: Family

## 2017-10-28 VITALS — BP 124/74 | HR 92 | Temp 97.8°F | Ht 64.0 in | Wt 230.6 lb

## 2017-10-28 DIAGNOSIS — F419 Anxiety disorder, unspecified: Secondary | ICD-10-CM | POA: Diagnosis not present

## 2017-10-28 DIAGNOSIS — F329 Major depressive disorder, single episode, unspecified: Secondary | ICD-10-CM

## 2017-10-28 DIAGNOSIS — G8929 Other chronic pain: Secondary | ICD-10-CM | POA: Diagnosis not present

## 2017-10-28 DIAGNOSIS — M25562 Pain in left knee: Secondary | ICD-10-CM

## 2017-10-28 DIAGNOSIS — F32A Depression, unspecified: Secondary | ICD-10-CM

## 2017-10-28 MED ORDER — MELOXICAM 7.5 MG PO TABS: 7.5000 mg | ORAL_TABLET | Freq: Every day | ORAL | 1 refills | Status: DC

## 2017-10-28 NOTE — Patient Instructions (Signed)
Trial of mobic Stop all other antiflammatories- aleve, ibuprofen, advil   ICE  Ace wrap  Let me know if not better

## 2017-10-28 NOTE — Assessment & Plan Note (Signed)
Worsening. Trial of meloxicam, physical therapy, ice. If no improvement, patient will let me know and we will refer to orthopedics.

## 2017-10-28 NOTE — Progress Notes (Signed)
Subjective:    Patient ID: Taylor Wagner, female    DOB: 02/13/1976, 41 y.o.   MRN: [id number]  CC: Taylor Wagner is a 41 y.o. female who presents today for follow up.   HPI: Anxiety and depression-started Wellbutrin last visit. Quite feeling 'much better". No thoughts of hurting herself or anyone else. Pleased with weight loss. Husband also notes a difference   Left knee pain- chronic. For years, comes and goes. Times in which painful to walk, esp stairs. Worse of late due to weather.  Occasional swelling. No falls, injury. Tried hemp-oil with some relief. hasnt tried ice  No CKD    HISTORY:  Past Medical History:  Diagnosis Date  . ADD (attention deficit disorder with hyperactivity)   . Allergy   . Depression    Past Surgical History:  Procedure Laterality Date  . CESAREAN SECTION     x3  . FOOT SURGERY     pin removal  . TUBAL LIGATION     Family History  Problem Relation Age of Onset  . Stroke Father   . Stroke Maternal Aunt   . Diabetes Maternal Aunt   . Stroke Maternal Grandfather   . Stroke Paternal Grandfather     Allergies: Beef-derived products; Erythromycin; and Pork-derived products Current Outpatient Medications on File Prior to Visit  Medication Sig Dispense Refill  . buPROPion (WELLBUTRIN XL) 300 MG 24 hr tablet Take 1 tablet (300 mg total) by mouth daily. 90 tablet 2  . EPINEPHrine (EPIPEN) 0.3 mg/0.3 mL DEVI Inject 0.3 mLs (0.3 mg total) into the muscle once. 2 Device 2  . sertraline (ZOLOFT) 100 MG tablet Take 1 tablet (100 mg total) by mouth daily. 90 tablet 3   No current facility-administered medications on file prior to visit.     Social History   Tobacco Use  . Smoking status: Former Games developermoker  . Smokeless tobacco: Never Used  Substance Use Topics  . Alcohol use: Yes    Comment: occasional  . Drug use: No    Review of Systems  Constitutional: Negative for chills and fever.  Respiratory: Negative for cough.     Cardiovascular: Negative for chest pain and palpitations.  Gastrointestinal: Negative for nausea and vomiting.  Musculoskeletal: Positive for joint swelling. Negative for arthralgias.  Psychiatric/Behavioral: The patient is not nervous/anxious.       Objective:    BP 124/74   Pulse 92   Temp 97.8 F (36.6 C) (Oral)   Ht 5\' 4"  (1.626 m)   Wt 230 lb 9.6 oz (104.6 kg)   SpO2 97%   BMI 39.58 kg/m  BP Readings from Last 3 Encounters:  10/28/17 124/74  07/27/17 135/85  05/27/16 (!) 138/94   Wt Readings from Last 3 Encounters:  10/28/17 230 lb 9.6 oz (104.6 kg)  07/27/17 243 lb 9.6 oz (110.5 kg)  05/27/16 262 lb (118.8 kg)    Physical Exam  Constitutional: She appears well-developed and well-nourished.  Eyes: Conjunctivae are normal.  Cardiovascular: Normal rate, regular rhythm, normal heart sounds and normal pulses.  Pulmonary/Chest: Effort normal and breath sounds normal. She has no wheezes. She has no rhonchi. She has no rales.  Musculoskeletal:       Left knee: She exhibits normal range of motion, no swelling, no effusion, no ecchymosis, no deformity and no erythema. Tenderness found. Lateral joint line tenderness noted.  Bilateral knees are symmetric. Bony changes more prominent in left knee. No effusion appreciated. No increase in warmth or erythema.  Crepitus felt with flexion of bilateral knees.  Right knee:  Able to extend to -5 to 10 degrees and flex to 110 degrees. No catching with McMurray maneuver. No patellar apprehension. Negative anterior drawer and lachman's- no laxity appreciated.  No calf tenderness of lower leg edema bilaterally.    Neurological: She is alert.  Skin: Skin is warm and dry.  Psychiatric: She has a normal mood and affect. Her speech is normal and behavior is normal. Thought content normal.  Vitals reviewed.      Assessment & Plan:   Problem List Items Addressed This Visit      Other   Chronic pain of left knee - Primary    Worsening.  Trial of meloxicam, physical therapy, ice. If no improvement, patient will let me know and we will refer to orthopedics.      Relevant Medications   meloxicam (MOBIC) 7.5 MG tablet   Other Relevant Orders   Ambulatory referral to Physical Therapy   Anxiety and depression    Improved. Will continue current regimen          I am having Taylor Wagner start on meloxicam. I am also having her maintain her EPINEPHrine, buPROPion, and sertraline.   Meds ordered this encounter  Medications  . meloxicam (MOBIC) 7.5 MG tablet    Sig: Take 1 tablet (7.5 mg total) daily by mouth.    Dispense:  30 tablet    Refill:  1    Order Specific Question:   Supervising Provider    Answer:   Sherlene ShamsULLO, TERESA L [[id number]]    Return precautions given.   Risks, benefits, and alternatives of the medications and treatment plan prescribed today were discussed, and patient expressed understanding.   Education regarding symptom management and diagnosis given to patient on AVS.  Continue to follow with Allegra GranaArnett, Chinenye Katzenberger G, FNP for routine health maintenance.   Taylor Wagner and I agreed with plan.   Rennie PlowmanMargaret Kaidan Harpster, FNP

## 2017-10-28 NOTE — Assessment & Plan Note (Signed)
Improved. Will continue current regimen.  

## 2017-10-28 NOTE — Progress Notes (Signed)
Pre visit review using our clinic review tool, if applicable. No additional management support is needed unless otherwise documented below in the visit note. 

## 2018-04-07 ENCOUNTER — Other Ambulatory Visit: Payer: Self-pay | Admitting: Family

## 2018-04-07 DIAGNOSIS — F329 Major depressive disorder, single episode, unspecified: Secondary | ICD-10-CM

## 2018-04-07 DIAGNOSIS — F32A Depression, unspecified: Secondary | ICD-10-CM

## 2018-04-11 NOTE — Telephone Encounter (Signed)
Okay to refill? Last seen on 10/28/17. No future appt scheduled.

## 2018-06-29 ENCOUNTER — Other Ambulatory Visit: Payer: Self-pay | Admitting: Family

## 2018-06-29 DIAGNOSIS — F32A Depression, unspecified: Secondary | ICD-10-CM

## 2018-06-29 DIAGNOSIS — F329 Major depressive disorder, single episode, unspecified: Secondary | ICD-10-CM

## 2018-08-26 ENCOUNTER — Other Ambulatory Visit: Payer: Self-pay | Admitting: Family

## 2018-08-26 DIAGNOSIS — F329 Major depressive disorder, single episode, unspecified: Secondary | ICD-10-CM

## 2018-08-26 DIAGNOSIS — F32A Depression, unspecified: Secondary | ICD-10-CM

## 2018-08-29 ENCOUNTER — Other Ambulatory Visit: Payer: Self-pay | Admitting: Family

## 2018-08-29 DIAGNOSIS — F329 Major depressive disorder, single episode, unspecified: Secondary | ICD-10-CM

## 2018-08-29 DIAGNOSIS — F32A Depression, unspecified: Secondary | ICD-10-CM

## 2018-11-18 ENCOUNTER — Other Ambulatory Visit: Payer: Self-pay | Admitting: Family

## 2018-11-18 DIAGNOSIS — F32A Depression, unspecified: Secondary | ICD-10-CM

## 2018-11-18 DIAGNOSIS — F329 Major depressive disorder, single episode, unspecified: Secondary | ICD-10-CM

## 2018-11-22 ENCOUNTER — Other Ambulatory Visit: Payer: Self-pay | Admitting: Family

## 2018-11-22 DIAGNOSIS — F32A Depression, unspecified: Secondary | ICD-10-CM

## 2018-11-22 DIAGNOSIS — F329 Major depressive disorder, single episode, unspecified: Secondary | ICD-10-CM

## 2019-02-11 ENCOUNTER — Other Ambulatory Visit: Payer: Self-pay | Admitting: Family

## 2019-02-11 DIAGNOSIS — F329 Major depressive disorder, single episode, unspecified: Secondary | ICD-10-CM

## 2019-02-11 DIAGNOSIS — F32A Depression, unspecified: Secondary | ICD-10-CM

## 2019-02-15 ENCOUNTER — Other Ambulatory Visit: Payer: Self-pay | Admitting: Family

## 2019-02-15 DIAGNOSIS — F32A Depression, unspecified: Secondary | ICD-10-CM

## 2019-02-15 DIAGNOSIS — F329 Major depressive disorder, single episode, unspecified: Secondary | ICD-10-CM

## 2019-02-16 NOTE — Telephone Encounter (Signed)
Refilled: 11/23/2018 Last OV: 10/28/2017 Next OV: not scheduled

## 2019-02-17 NOTE — Telephone Encounter (Signed)
Call pt Courtesy refill of wellbutrin for 30 days No further refills until she has f/u

## 2019-02-21 NOTE — Telephone Encounter (Signed)
LMTCB to schedule for f/u so she can continue to get medications.

## 2019-02-28 ENCOUNTER — Other Ambulatory Visit: Payer: Self-pay

## 2019-02-28 DIAGNOSIS — F329 Major depressive disorder, single episode, unspecified: Secondary | ICD-10-CM

## 2019-02-28 DIAGNOSIS — F32A Depression, unspecified: Secondary | ICD-10-CM

## 2019-03-07 ENCOUNTER — Other Ambulatory Visit: Payer: Self-pay

## 2019-03-07 DIAGNOSIS — F32A Depression, unspecified: Secondary | ICD-10-CM

## 2019-03-07 DIAGNOSIS — F329 Major depressive disorder, single episode, unspecified: Secondary | ICD-10-CM

## 2019-03-07 MED ORDER — BUPROPION HCL ER (XL) 300 MG PO TB24: 300.0000 mg | ORAL_TABLET | Freq: Every day | ORAL | 0 refills | Status: DC

## 2019-03-13 ENCOUNTER — Encounter: Payer: Self-pay | Admitting: Family

## 2019-03-13 ENCOUNTER — Ambulatory Visit (INDEPENDENT_AMBULATORY_CARE_PROVIDER_SITE_OTHER): Payer: BLUE CROSS/BLUE SHIELD | Admitting: Family

## 2019-03-13 ENCOUNTER — Other Ambulatory Visit: Payer: Self-pay

## 2019-03-13 VITALS — BP 120/78 | HR 85 | Temp 98.2°F | Wt 220.4 lb

## 2019-03-13 DIAGNOSIS — F419 Anxiety disorder, unspecified: Secondary | ICD-10-CM | POA: Diagnosis not present

## 2019-03-13 DIAGNOSIS — F329 Major depressive disorder, single episode, unspecified: Secondary | ICD-10-CM | POA: Diagnosis not present

## 2019-03-13 DIAGNOSIS — Z1239 Encounter for other screening for malignant neoplasm of breast: Secondary | ICD-10-CM

## 2019-03-13 DIAGNOSIS — F32A Depression, unspecified: Secondary | ICD-10-CM

## 2019-03-13 DIAGNOSIS — Z0000 Encounter for general adult medical examination without abnormal findings: Secondary | ICD-10-CM | POA: Insufficient documentation

## 2019-03-13 MED ORDER — SERTRALINE HCL 100 MG PO TABS: 100.0000 mg | ORAL_TABLET | Freq: Every day | ORAL | 3 refills | Status: DC

## 2019-03-13 MED ORDER — BUPROPION HCL ER (XL) 300 MG PO TB24: 300.0000 mg | ORAL_TABLET | Freq: Every day | ORAL | 3 refills | Status: DC

## 2019-03-13 NOTE — Patient Instructions (Addendum)
Please call call and schedule your 3D mammogram  as discussed. They will need mammogram from [place].    [facility]  [address]  [place], [place]  [phone]    Nice to see you!

## 2019-03-13 NOTE — Assessment & Plan Note (Signed)
Overdue.  Ordered today for patient.  She understands to schedule

## 2019-03-13 NOTE — Progress Notes (Signed)
Subjective:    Patient ID: Taylor Taylor Wagner, female    DOB: 12-Aug-1976, 43 y.o.   MRN: [id number]  CC: Taylor Taylor Wagner is a 43 y.o. female who presents today for follow up.   HPI: Feels well today   No complaints  Gad-doing well on Zoloft, Wellbutrin.  Feels like it is "maintenance" at this point.  No depression, anxiety.  Sleeping well.  No thoughts of hurting or anyone else.  Would like refills ofmedications.  Overdue for Pap smear, mammogram, influenza vaccine, Tdap    HISTORY:  Past Medical History:  Diagnosis Date  . ADD (attention deficit disorder with hyperactivity)   . Allergy   . Depression    Past Surgical History:  Procedure Laterality Date  . CESAREAN SECTION     x3  . FOOT SURGERY     pin removal  . TUBAL LIGATION     Family History  Problem Relation Age of Onset  . Stroke Father   . Stroke Maternal Aunt   . Diabetes Maternal Aunt   . Stroke Maternal Grandfather   . Stroke Paternal Grandfather     Allergies: Beef-derived products; Erythromycin; and Pork-derived products Current Outpatient Medications on File Prior to Visit  Medication Sig Dispense Refill  . EPINEPHrine (EPIPEN) 0.3 mg/0.3 mL DEVI Inject 0.3 mLs (0.3 mg total) into the muscle once. 2 Device 2   No current facility-administered medications on file prior to visit.     Social History   Tobacco Use  . Smoking status: Former Games developer  . Smokeless tobacco: Never Used  Substance Use Topics  . Alcohol use: Yes    Comment: occasional  . Drug use: No    Review of Systems  Constitutional: Negative for chills and fever.  Respiratory: Negative for cough.   Cardiovascular: Negative for chest pain and palpitations.  Gastrointestinal: Negative for nausea and vomiting.  Psychiatric/Behavioral: Negative for sleep disturbance. The patient is not nervous/anxious.       Objective:    BP 120/78 (BP Location: Left Arm, Patient Position: Sitting, Cuff Size: Large)   Pulse 85    Temp 98.2 F (36.8 C)   Wt 220 lb 6.4 oz (100 kg)   SpO2 99%   BMI 37.83 kg/m  BP Readings from Last 3 Encounters:  03/13/19 120/78  10/28/17 124/74  07/27/17 135/85   Wt Readings from Last 3 Encounters:  03/13/19 220 lb 6.4 oz (100 kg)  10/28/17 230 lb 9.6 oz (104.6 kg)  07/27/17 243 lb 9.6 oz (110.5 kg)    Physical Exam Vitals signs reviewed.  Constitutional:      Appearance: She is well-developed.  Eyes:     Conjunctiva/sclera: Conjunctivae normal.  Cardiovascular:     Rate and Rhythm: Normal rate and regular rhythm.     Pulses: Normal pulses.     Heart sounds: Normal heart sounds.  Pulmonary:     Effort: Pulmonary effort is normal.     Breath sounds: Normal breath sounds. No wheezing, rhonchi or rales.  Skin:    General: Skin is warm and dry.  Neurological:     Mental Status: She is alert.  Psychiatric:        Speech: Speech normal.        Behavior: Behavior normal.        Thought Content: Thought content normal.        Assessment & Plan:   Problem List Items Addressed This Visit      Other   Anxiety  and depression - Primary    Doing well on current regimen, will continue.  Refills provided.  Will return for CPE.      Relevant Medications   buPROPion (WELLBUTRIN XL) 300 MG 24 hr tablet   sertraline (ZOLOFT) 100 MG tablet   Screening for breast cancer    Overdue.  Ordered today for patient.  She understands to schedule      Relevant Orders   MM 3D SCREEN BREAST BILATERAL    Other Visit Diagnoses    Depression, unspecified depression type       Relevant Medications   buPROPion (WELLBUTRIN XL) 300 MG 24 hr tablet   sertraline (ZOLOFT) 100 MG tablet       Taylor Wagner have discontinued Taylor Taylor Wagner. Taylor Taylor Wagner's meloxicam. Taylor Wagner have also changed her sertraline. Additionally, Taylor Wagner am having her maintain her EPINEPHrine and buPROPion.   Meds ordered this encounter  Medications  . buPROPion (WELLBUTRIN XL) 300 MG 24 hr tablet    Sig: Take 1 tablet (300 mg total) by  mouth daily.    Dispense:  90 tablet    Refill:  3    Order Specific Question:   Supervising Provider    Answer:   Taylor Taylor Wagner [[id number]]  . sertraline (ZOLOFT) 100 MG tablet    Sig: Take 1 tablet (100 mg total) by mouth daily.    Dispense:  90 tablet    Refill:  3    PATIENT NEEDS FOLLOW UP FOR FURTHER REFILLS.    Order Specific Question:   Supervising Provider    Answer:   Taylor Taylor Wagner [[id number]]    Return precautions given.   Risks, benefits, and alternatives of the medications and treatment plan prescribed today were discussed, and patient expressed understanding.   Education regarding symptom management and diagnosis given to patient on AVS.  Continue to follow with Taylor Grana, FNP for routine health maintenance.   Taylor Taylor Wagner agreed with plan.   Taylor Plowman, FNP

## 2019-03-13 NOTE — Assessment & Plan Note (Signed)
Doing well on current regimen, will continue.  Refills provided.  Will return for CPE.

## 2019-03-17 ENCOUNTER — Encounter: Payer: Self-pay | Admitting: Family

## 2019-04-24 ENCOUNTER — Encounter: Payer: Self-pay | Admitting: Family

## 2019-04-28 ENCOUNTER — Encounter: Payer: Self-pay | Admitting: Family

## 2019-06-19 ENCOUNTER — Telehealth: Payer: Self-pay

## 2019-06-19 ENCOUNTER — Ambulatory Visit: Payer: BLUE CROSS/BLUE SHIELD | Admitting: Family

## 2019-06-19 NOTE — Telephone Encounter (Signed)
Patient's appointment has already been cancelled.

## 2019-06-19 NOTE — Telephone Encounter (Signed)
Copied from [place] [id number]. Topic: Appointment Scheduling - Scheduling Inquiry for Clinic >> Jun 19, 2019  7:09 AM Rayann Heman wrote: Reason for CRM: pt called and stated that she need to cancel her appointment for this morning 06/19/19.

## 2019-09-22 ENCOUNTER — Encounter: Payer: Self-pay | Admitting: Family

## 2019-09-25 ENCOUNTER — Other Ambulatory Visit: Payer: Self-pay

## 2019-09-25 DIAGNOSIS — F329 Major depressive disorder, single episode, unspecified: Secondary | ICD-10-CM

## 2019-09-25 DIAGNOSIS — F32A Depression, unspecified: Secondary | ICD-10-CM

## 2019-09-25 MED ORDER — SERTRALINE HCL 100 MG PO TABS: 100.0000 mg | ORAL_TABLET | Freq: Every day | ORAL | 0 refills | Status: DC

## 2019-11-28 ENCOUNTER — Encounter: Payer: Self-pay | Admitting: Family

## 2019-11-28 DIAGNOSIS — Z91018 Allergy to other foods: Secondary | ICD-10-CM

## 2019-12-07 ENCOUNTER — Other Ambulatory Visit: Payer: Self-pay

## 2019-12-07 ENCOUNTER — Other Ambulatory Visit (INDEPENDENT_AMBULATORY_CARE_PROVIDER_SITE_OTHER): Payer: BC Managed Care – PPO

## 2019-12-07 DIAGNOSIS — Z91018 Allergy to other foods: Secondary | ICD-10-CM

## 2019-12-11 ENCOUNTER — Encounter: Payer: Self-pay | Admitting: Family

## 2019-12-12 LAB — ALPHA-GAL PANEL
Beef IgE: 32.4 kU/L — ABNORMAL HIGH (ref <0.35)
Class: 3
Class: 4
Class: 4
Galactose-alpha-1,3-galactose IgE: 56 kU/L — ABNORMAL HIGH (ref <0.10)
LAMB/MUTTON IGE: 14 kU/L — ABNORMAL HIGH (ref <0.35)
Pork IgE: 21.2 kU/L — ABNORMAL HIGH (ref <0.35)

## 2019-12-23 ENCOUNTER — Other Ambulatory Visit: Payer: Self-pay | Admitting: Family

## 2019-12-23 DIAGNOSIS — F329 Major depressive disorder, single episode, unspecified: Secondary | ICD-10-CM

## 2019-12-23 DIAGNOSIS — F32A Depression, unspecified: Secondary | ICD-10-CM

## 2019-12-24 ENCOUNTER — Encounter: Payer: Self-pay | Admitting: Family

## 2019-12-25 ENCOUNTER — Other Ambulatory Visit: Payer: Self-pay

## 2019-12-25 DIAGNOSIS — F32A Depression, unspecified: Secondary | ICD-10-CM

## 2019-12-25 DIAGNOSIS — F329 Major depressive disorder, single episode, unspecified: Secondary | ICD-10-CM

## 2019-12-25 MED ORDER — SERTRALINE HCL 100 MG PO TABS: 100.0000 mg | ORAL_TABLET | Freq: Every day | ORAL | 0 refills | Status: DC

## 2019-12-25 MED ORDER — SERTRALINE HCL 100 MG PO TABS: 100.0000 mg | ORAL_TABLET | Freq: Every day | ORAL | 1 refills | Status: DC

## 2020-01-13 ENCOUNTER — Encounter: Payer: Self-pay | Admitting: Family

## 2020-01-15 ENCOUNTER — Other Ambulatory Visit: Payer: Self-pay | Admitting: Lab

## 2020-01-15 ENCOUNTER — Telehealth: Payer: Self-pay | Admitting: Lab

## 2020-01-15 NOTE — Telephone Encounter (Signed)
Called Pt, she was not home. Left a message to call our office.

## 2020-01-17 ENCOUNTER — Other Ambulatory Visit: Payer: Self-pay

## 2020-01-17 DIAGNOSIS — F32A Depression, unspecified: Secondary | ICD-10-CM

## 2020-01-17 DIAGNOSIS — F329 Major depressive disorder, single episode, unspecified: Secondary | ICD-10-CM

## 2020-01-17 MED ORDER — BUPROPION HCL ER (XL) 300 MG PO TB24: 300.0000 mg | ORAL_TABLET | Freq: Every day | ORAL | 3 refills | Status: DC

## 2020-01-19 ENCOUNTER — Other Ambulatory Visit: Payer: Self-pay | Admitting: Family

## 2020-01-19 DIAGNOSIS — F32A Depression, unspecified: Secondary | ICD-10-CM

## 2020-01-19 DIAGNOSIS — F329 Major depressive disorder, single episode, unspecified: Secondary | ICD-10-CM

## 2020-02-06 ENCOUNTER — Encounter: Payer: Self-pay | Admitting: Family

## 2020-02-06 ENCOUNTER — Other Ambulatory Visit: Payer: Self-pay

## 2020-02-06 DIAGNOSIS — F329 Major depressive disorder, single episode, unspecified: Secondary | ICD-10-CM

## 2020-02-06 DIAGNOSIS — F32A Depression, unspecified: Secondary | ICD-10-CM

## 2020-02-06 MED ORDER — BUPROPION HCL ER (XL) 300 MG PO TB24: 300.0000 mg | ORAL_TABLET | Freq: Every day | ORAL | 1 refills | Status: DC

## 2020-02-24 ENCOUNTER — Other Ambulatory Visit: Payer: Self-pay | Admitting: Family

## 2020-02-24 DIAGNOSIS — F32A Depression, unspecified: Secondary | ICD-10-CM

## 2020-02-24 DIAGNOSIS — F329 Major depressive disorder, single episode, unspecified: Secondary | ICD-10-CM

## 2020-02-26 ENCOUNTER — Encounter: Payer: Self-pay | Admitting: Family

## 2020-02-27 ENCOUNTER — Other Ambulatory Visit: Payer: Self-pay

## 2020-02-27 DIAGNOSIS — F32A Depression, unspecified: Secondary | ICD-10-CM

## 2020-02-27 DIAGNOSIS — F329 Major depressive disorder, single episode, unspecified: Secondary | ICD-10-CM

## 2020-02-27 MED ORDER — SERTRALINE HCL 100 MG PO TABS: 100.0000 mg | ORAL_TABLET | Freq: Every day | ORAL | 3 refills | Status: DC

## 2020-03-25 ENCOUNTER — Encounter: Payer: Self-pay | Admitting: Family

## 2020-03-26 ENCOUNTER — Other Ambulatory Visit: Payer: Self-pay

## 2020-03-27 ENCOUNTER — Other Ambulatory Visit: Payer: Self-pay

## 2020-03-27 DIAGNOSIS — F329 Major depressive disorder, single episode, unspecified: Secondary | ICD-10-CM

## 2020-03-27 DIAGNOSIS — F32A Depression, unspecified: Secondary | ICD-10-CM

## 2020-03-27 MED ORDER — SERTRALINE HCL 100 MG PO TABS: 100.0000 mg | ORAL_TABLET | Freq: Every day | ORAL | 3 refills | Status: DC

## 2020-04-05 ENCOUNTER — Other Ambulatory Visit: Payer: Self-pay | Admitting: Family

## 2020-04-05 DIAGNOSIS — F32A Depression, unspecified: Secondary | ICD-10-CM

## 2020-04-05 DIAGNOSIS — F329 Major depressive disorder, single episode, unspecified: Secondary | ICD-10-CM

## 2020-05-11 ENCOUNTER — Encounter (INDEPENDENT_AMBULATORY_CARE_PROVIDER_SITE_OTHER): Payer: Self-pay

## 2020-06-02 ENCOUNTER — Other Ambulatory Visit: Payer: Self-pay | Admitting: Family

## 2020-06-02 DIAGNOSIS — F32A Depression, unspecified: Secondary | ICD-10-CM

## 2020-06-05 ENCOUNTER — Encounter: Payer: Self-pay | Admitting: Family

## 2020-06-06 NOTE — Telephone Encounter (Signed)
Called pharmacy to see if patient picked up prescription sent in on 06/03/2020. Spoke to [facility]. Misty Stanley states that they have not picked up the medication.  Called patient to inform them that the prescription was sent to the pharmacy. Phone call could not be completed and ended without leaving a message.

## 2020-08-05 ENCOUNTER — Other Ambulatory Visit: Payer: Self-pay | Admitting: Family

## 2020-08-05 DIAGNOSIS — F32A Depression, unspecified: Secondary | ICD-10-CM

## 2020-08-05 DIAGNOSIS — F329 Major depressive disorder, single episode, unspecified: Secondary | ICD-10-CM

## 2020-08-09 ENCOUNTER — Encounter: Payer: Self-pay | Admitting: Family

## 2020-08-09 ENCOUNTER — Telehealth: Payer: Self-pay | Admitting: Family

## 2020-08-09 ENCOUNTER — Other Ambulatory Visit: Payer: Self-pay | Admitting: Family

## 2020-08-09 MED ORDER — EPINEPHRINE 0.3 MG/0.3ML IJ SOAJ: 0.3000 mg | INTRAMUSCULAR | 2 refills | Status: AC | PRN

## 2020-08-09 NOTE — Telephone Encounter (Signed)
Pt needs a refill on EPINEPHrine (EPIPEN) 0.3 mg/0.3 mL DEVI

## 2020-08-09 NOTE — Addendum Note (Signed)
Addended by: Allegra Grana on: 08/09/2020 01:38 PM   Modules accepted: Orders

## 2020-08-09 NOTE — Telephone Encounter (Signed)
Call pt Refilled epi pen She needs to make an appt with me ANNUALLY and she is over due for me to refill medications Please make

## 2020-08-09 NOTE — Telephone Encounter (Signed)
Sent a response to a mychart message for the patient to call our office and schedule an appointment as she is overdue a visit for any more medication to be refilled. Last appointment was on 03/12/20

## 2020-09-06 NOTE — Telephone Encounter (Signed)
err

## 2020-09-11 ENCOUNTER — Ambulatory Visit: Payer: BC Managed Care – PPO | Admitting: Family

## 2020-09-13 ENCOUNTER — Other Ambulatory Visit: Payer: Self-pay

## 2020-09-13 ENCOUNTER — Telehealth (INDEPENDENT_AMBULATORY_CARE_PROVIDER_SITE_OTHER): Payer: BC Managed Care – PPO | Admitting: Family

## 2020-09-13 ENCOUNTER — Encounter: Payer: Self-pay | Admitting: Family

## 2020-09-13 VITALS — Ht 64.0 in | Wt 220.0 lb

## 2020-09-13 DIAGNOSIS — F329 Major depressive disorder, single episode, unspecified: Secondary | ICD-10-CM

## 2020-09-13 DIAGNOSIS — F32A Depression, unspecified: Secondary | ICD-10-CM

## 2020-09-13 DIAGNOSIS — F419 Anxiety disorder, unspecified: Secondary | ICD-10-CM

## 2020-09-13 MED ORDER — SERTRALINE HCL 50 MG PO TABS: 50.0000 mg | ORAL_TABLET | Freq: Every day | ORAL | 3 refills | Status: DC

## 2020-09-13 NOTE — Progress Notes (Signed)
Virtual Visit via Video Note  I connected with@  on 09/20/20 at  4:00 PM EDT by a video enabled telemedicine application and verified that I am speaking with the correct person using two identifiers.  Location patient: home Location provider:work  Persons participating in the virtual visit: patient, provider  I discussed the limitations of evaluation and management by telemedicine and the availability of in person appointments. The patient expressed understanding and agreed to proceed.   HPI:  Depression and anxiety- has fluctuated somewhat, having more down days with depression.  compliant with zoloft 100mg  and wellbutrin 300mg  as both of them have been helpful.    H/o ADD - Most bothered by concentration recently and affects her at work and at home. Had been on Vyvanse, adderall in the past. Wandering if more of the depression affecting concentration. Sleeping well. No drug or alcohol use.  Not tearful. Not anxiety attacks.  No si/hi.  Enjoys work.    ROS: See pertinent positives and negatives per HPI.    EXAM:  VITALS per patient if applicable:  GENERAL: alert, oriented, appears well and in no acute distress  HEENT: atraumatic, conjunttiva clear, no obvious abnormalities on inspection of external nose and ears  NECK: normal movements of the head and neck  LUNGS: on inspection no signs of respiratory distress, breathing rate appears normal, no obvious gross SOB, gasping or wheezing  CV: no obvious cyanosis  MS: moves all visible extremities without noticeable abnormality  PSYCH/NEURO: pleasant and cooperative, no obvious depression or anxiety, speech and thought processing grossly intact  ASSESSMENT AND PLAN:  Discussed the following assessment and plan:  Problem List Items Addressed This Visit      Other   Anxiety and depression - Primary    Worsening depression and patient feels depression affecting concentration. She prefers to increase zoloft prior to  pursuing ADHD testing. Increase zoloft to 150mg  qhs. She will let me know how she is doing.      Relevant Medications   sertraline (ZOLOFT) 50 MG tablet      -we discussed possible serious and likely etiologies, options for evaluation and workup, limitations of telemedicine visit vs in person visit, treatment, treatment risks and precautions. Pt prefers to treat via telemedicine empirically rather then risking or undertaking an in person visit at this moment.  .   I discussed the assessment and treatment plan with the patient. The patient was provided an opportunity to ask questions and all were answered. The patient agreed with the plan and demonstrated an understanding of the instructions.   The patient was advised to call back or seek an in-person evaluation if the symptoms worsen or if the condition fails to improve as anticipated.   , FNP

## 2020-09-13 NOTE — Patient Instructions (Signed)
So nice to see you!   

## 2020-09-20 NOTE — Assessment & Plan Note (Signed)
Worsening depression and patient feels depression affecting concentration. She prefers to increase zoloft prior to pursuing ADHD testing. Increase zoloft to 150mg  qhs. She will let me know how she is doing.

## 2020-10-04 ENCOUNTER — Other Ambulatory Visit: Payer: Self-pay | Admitting: Family

## 2020-10-04 DIAGNOSIS — F32A Depression, unspecified: Secondary | ICD-10-CM

## 2020-10-04 MED ORDER — BUPROPION HCL ER (XL) 300 MG PO TB24: 300.0000 mg | ORAL_TABLET | Freq: Every day | ORAL | 1 refills | Status: DC

## 2020-10-04 NOTE — Addendum Note (Signed)
Addended by: Hulan Fray on: 10/04/2020 02:40 PM   Modules accepted: Orders

## 2020-11-15 ENCOUNTER — Other Ambulatory Visit: Payer: Self-pay | Admitting: Family

## 2020-11-15 DIAGNOSIS — F32A Depression, unspecified: Secondary | ICD-10-CM

## 2020-11-29 ENCOUNTER — Other Ambulatory Visit: Payer: Self-pay | Admitting: Family

## 2020-11-29 DIAGNOSIS — F32A Depression, unspecified: Secondary | ICD-10-CM

## 2020-12-16 ENCOUNTER — Ambulatory Visit: Payer: BC Managed Care – PPO | Admitting: Family

## 2020-12-18 ENCOUNTER — Encounter: Payer: Self-pay | Admitting: Family

## 2020-12-18 ENCOUNTER — Ambulatory Visit (INDEPENDENT_AMBULATORY_CARE_PROVIDER_SITE_OTHER): Payer: BC Managed Care – PPO | Admitting: Family

## 2020-12-18 ENCOUNTER — Other Ambulatory Visit: Payer: Self-pay

## 2020-12-18 VITALS — BP 138/88 | HR 104 | Temp 98.8°F | Ht 64.0 in | Wt 230.6 lb

## 2020-12-18 DIAGNOSIS — F32A Depression, unspecified: Secondary | ICD-10-CM

## 2020-12-18 DIAGNOSIS — F419 Anxiety disorder, unspecified: Secondary | ICD-10-CM

## 2020-12-18 DIAGNOSIS — Z23 Encounter for immunization: Secondary | ICD-10-CM | POA: Diagnosis not present

## 2020-12-18 DIAGNOSIS — Z1231 Encounter for screening mammogram for malignant neoplasm of breast: Secondary | ICD-10-CM | POA: Diagnosis not present

## 2020-12-18 LAB — COMPREHENSIVE METABOLIC PANEL
ALT: 13 U/L (ref 0–35)
AST: 11 U/L (ref 0–37)
Albumin: 4.3 g/dL (ref 3.5–5.2)
Alkaline Phosphatase: 54 U/L (ref 39–117)
BUN: 12 mg/dL (ref 6–23)
CO2: 26 mEq/L (ref 19–32)
Calcium: 8.9 mg/dL (ref 8.4–10.5)
Chloride: 103 mEq/L (ref 96–112)
Creatinine, Ser: 0.67 mg/dL (ref 0.40–1.20)
GFR: 107.12 mL/min (ref >60.00)
Glucose, Bld: 92 mg/dL (ref 70–99)
Potassium: 4.6 mEq/L (ref 3.5–5.1)
Sodium: 136 mEq/L (ref 135–145)
Total Bilirubin: 0.3 mg/dL (ref 0.2–1.2)
Total Protein: 6.8 g/dL (ref 6.0–8.3)

## 2020-12-18 LAB — LIPID PANEL
Cholesterol: 175 mg/dL (ref 0–200)
HDL: 75.8 mg/dL (ref >39.00)
LDL Cholesterol: 90 mg/dL (ref 0–99)
NonHDL: 99.42
Total CHOL/HDL Ratio: 2
Triglycerides: 45 mg/dL (ref 0.0–149.0)
VLDL: 9 mg/dL (ref 0.0–40.0)

## 2020-12-18 LAB — CBC WITH DIFFERENTIAL/PLATELET
Basophils Absolute: 0 10*3/uL (ref 0.0–0.1)
Basophils Relative: 0.8 % (ref 0.0–3.0)
Eosinophils Absolute: 0.2 10*3/uL (ref 0.0–0.7)
Eosinophils Relative: 3.6 % (ref 0.0–5.0)
HCT: 38.2 % (ref 36.0–46.0)
Hemoglobin: 12.6 g/dL (ref 12.0–15.0)
Lymphocytes Relative: 29.2 % (ref 12.0–46.0)
Lymphs Abs: 1.5 10*3/uL (ref 0.7–4.0)
MCHC: 32.9 g/dL (ref 30.0–36.0)
MCV: 91.4 fl (ref 78.0–100.0)
Monocytes Absolute: 0.4 10*3/uL (ref 0.1–1.0)
Monocytes Relative: 8.2 % (ref 3.0–12.0)
Neutro Abs: 2.9 10*3/uL (ref 1.4–7.7)
Neutrophils Relative %: 58.2 % (ref 43.0–77.0)
Platelets: 295 10*3/uL (ref 150.0–400.0)
RBC: 4.18 Mil/uL (ref 3.87–5.11)
RDW: 14.3 % (ref 11.5–15.5)
WBC: 5 10*3/uL (ref 4.0–10.5)

## 2020-12-18 LAB — VITAMIN D 25 HYDROXY (VIT D DEFICIENCY, FRACTURES): VITD: 26.84 ng/mL — ABNORMAL LOW (ref 30.00–100.00)

## 2020-12-18 LAB — B12 AND FOLATE PANEL
Folate: >23.6 ng/mL (ref >5.9)
Vitamin B-12: 216 pg/mL (ref 211–911)

## 2020-12-18 LAB — HEMOGLOBIN A1C: Hgb A1c MFr Bld: 5.7 % (ref 4.6–6.5)

## 2020-12-18 LAB — TSH: TSH: 1.56 u[IU]/mL (ref 0.35–4.50)

## 2020-12-18 MED ORDER — FLUOXETINE HCL 20 MG PO CAPS: 20.0000 mg | ORAL_CAPSULE | Freq: Every morning | ORAL | 3 refills | Status: DC

## 2020-12-18 NOTE — Patient Instructions (Signed)
Stop zoloft; yesterday was last dose.  Start prozac today Referral for ADHD testing Let us know if you dont hear back within a week in regards to an appointment being scheduled.   Please call  and schedule your 3D mammogram as discussed.   [facility]  [address]  [place], [place]  [phone]   Stay safe!

## 2020-12-18 NOTE — Progress Notes (Signed)
Subjective:    Patient ID: Taylor Wagner, female    DOB: Sep 30, 1976, 44 y.o.   MRN: [id number]  CC: Taylor Wagner is a 44 y.o. female who presents today for follow up.   HPI: Depression hasnt improved on increased dose of zoloft. She is sleeping well. No si/hi. Continues to struggle with concentration at work and thinks this is 'exhausting.' She finds it hard to motivate.  Had been on effexor years ago and didn't care for medication. No anxiety.  Compliant with wellbutrin and finds this helpful.     HISTORY:  Past Medical History:  Diagnosis Date  . ADD (attention deficit disorder with hyperactivity)   . Allergy   . Depression    Past Surgical History:  Procedure Laterality Date  . CESAREAN SECTION     x3  . FOOT SURGERY     pin removal  . TUBAL LIGATION     Family History  Problem Relation Age of Onset  . Stroke Father   . Stroke Maternal Aunt   . Diabetes Maternal Aunt   . Stroke Maternal Grandfather   . Stroke Paternal Grandfather     Allergies: Beef-derived products, Erythromycin, and Pork-derived products Current Outpatient Medications on File Prior to Visit  Medication Sig Dispense Refill  . buPROPion (WELLBUTRIN XL) 300 MG 24 hr tablet TAKE 1 TABLET BY MOUTH EVERY DAY 30 tablet 1  . EPINEPHrine 0.3 mg/0.3 mL IJ SOAJ injection Inject 0.3 mLs (0.3 mg total) into the muscle as needed for anaphylaxis. 1 each 2   No current facility-administered medications on file prior to visit.    Social History   Tobacco Use  . Smoking status: Former Games developer  . Smokeless tobacco: Never Used  Substance Use Topics  . Alcohol use: Yes    Comment: occasional  . Drug use: No    Review of Systems  Constitutional: Positive for fatigue. Negative for chills and fever.  Respiratory: Negative for cough.   Cardiovascular: Negative for chest pain and palpitations.  Gastrointestinal: Negative for nausea and vomiting.  Psychiatric/Behavioral: Positive for  decreased concentration. Negative for sleep disturbance. The patient is not nervous/anxious.       Objective:    BP 138/88   Pulse (!) 104   Temp 98.8 F (37.1 C)   Ht 5\' 4"  (1.626 m)   Wt 230 lb 9.6 oz (104.6 kg)   SpO2 98%   BMI 39.58 kg/m  BP Readings from Last 3 Encounters:  12/18/20 138/88  03/13/19 120/78  10/28/17 124/74   Wt Readings from Last 3 Encounters:  12/18/20 230 lb 9.6 oz (104.6 kg)  09/13/20 220 lb (99.8 kg)  03/13/19 220 lb 6.4 oz (100 kg)    Physical Exam Vitals reviewed.  Constitutional:      Appearance: She is well-developed and well-nourished.  Eyes:     Conjunctiva/sclera: Conjunctivae normal.  Cardiovascular:     Rate and Rhythm: Normal rate and regular rhythm.     Pulses: Normal pulses.     Heart sounds: Normal heart sounds.  Pulmonary:     Effort: Pulmonary effort is normal.     Breath sounds: Normal breath sounds. No wheezing, rhonchi or rales.  Skin:    General: Skin is warm and dry.  Neurological:     Mental Status: She is alert.  Psychiatric:        Mood and Affect: Mood and affect normal.        Speech: Speech normal.  Behavior: Behavior normal.        Thought Content: Thought content normal.        Assessment & Plan:   Problem List Items Addressed This Visit      Other   Anxiety and depression - Primary    Uncontrolled. Baseline EKG showed NSR. No significant changes from prior 12/2013.Qt 366.  We opted to discontinue zoloft 150mg  and start more activating SSRI, Prozac 20mg . Continue wellbutrin 300mg . Referral for ADHD testing. Close followup.       Relevant Medications   FLUoxetine (PROZAC) 20 MG capsule   Other Relevant Orders   TSH   CBC with Differential/Platelet   Comprehensive metabolic panel   Hemoglobin A1c   Lipid panel   VITAMIN D 25 Hydroxy (Vit-D Deficiency, Fractures)   B12 and Folate Panel   HIV Antibody (routine testing w rflx)   Hepatitis C antibody   Ambulatory referral to Psychiatry   EKG  12-Lead (Completed)   Screening for breast cancer    Ordered, patient will schedule.       Relevant Orders   MM 3D SCREEN BREAST BILATERAL    Other Visit Diagnoses    Need for immunization against influenza       Relevant Orders   Flu Vaccine QUAD 36+ mos IM (Completed)       I have discontinued . Blahut's sertraline and sertraline. I am also having her start on FLUoxetine. Additionally, I am having her maintain her EPINEPHrine and buPROPion.   Meds ordered this encounter  Medications  . FLUoxetine (PROZAC) 20 MG capsule    Sig: Take 1 capsule (20 mg total) by mouth every morning.    Dispense:  90 capsule    Refill:  3    Order Specific Question:   Supervising Provider    Answer:   [[id number]]    Return precautions given.   Risks, benefits, and alternatives of the medications and treatment plan prescribed today were discussed, and patient expressed understanding.   Education regarding symptom management and diagnosis given to patient on AVS.  Continue to follow with , FNP for routine health maintenance.   Joelene Millin and I agreed with plan.   Sherlene Shams, FNP

## 2020-12-18 NOTE — Assessment & Plan Note (Addendum)
Uncontrolled. Baseline EKG showed NSR. No significant changes from prior 12/2013.Qt 366.  We opted to discontinue zoloft 150mg  and start more activating SSRI, Prozac 20mg . Continue wellbutrin 300mg . Referral for ADHD testing. Close followup.

## 2020-12-18 NOTE — Assessment & Plan Note (Signed)
Ordered, patient will schedule 

## 2020-12-19 LAB — HIV ANTIBODY (ROUTINE TESTING W REFLEX): HIV 1&2 Ab, 4th Generation: NONREACTIVE

## 2020-12-19 LAB — HEPATITIS C ANTIBODY
Hepatitis C Ab: NONREACTIVE
SIGNAL TO CUT-OFF: 0.01 (ref <1.00)

## 2020-12-24 ENCOUNTER — Other Ambulatory Visit: Payer: Self-pay

## 2020-12-24 MED ORDER — "BD ECLIPSE SYRINGE 25G X 5/8"" 1 ML MISC": 2 refills | Status: DC

## 2020-12-24 MED ORDER — CYANOCOBALAMIN 1000 MCG/ML IJ SOLN: INTRAMUSCULAR | 2 refills | Status: DC

## 2020-12-24 MED ORDER — "BD DISP NEEDLE 25G X 1"" MISC": 2 refills | Status: DC

## 2021-01-01 ENCOUNTER — Encounter: Payer: Self-pay | Admitting: Family

## 2021-01-29 ENCOUNTER — Encounter: Payer: BC Managed Care – PPO | Admitting: Family

## 2021-01-30 ENCOUNTER — Other Ambulatory Visit: Payer: Self-pay | Admitting: Family

## 2021-01-30 DIAGNOSIS — F32A Depression, unspecified: Secondary | ICD-10-CM

## 2021-02-07 ENCOUNTER — Other Ambulatory Visit (HOSPITAL_COMMUNITY)
Admission: RE | Admit: 2021-02-07 | Discharge: 2021-02-07 | Disposition: A | Discharge status: Home / Self Care | Payer: BC Managed Care – PPO | Source: Ambulatory Visit | Attending: Family | Admitting: Family

## 2021-02-07 ENCOUNTER — Ambulatory Visit (INDEPENDENT_AMBULATORY_CARE_PROVIDER_SITE_OTHER): Payer: BC Managed Care – PPO | Admitting: Family

## 2021-02-07 ENCOUNTER — Other Ambulatory Visit: Payer: Self-pay

## 2021-02-07 ENCOUNTER — Encounter: Payer: Self-pay | Admitting: Family

## 2021-02-07 VITALS — BP 122/90 | HR 95 | Temp 98.7°F | Ht 64.02 in | Wt 227.4 lb

## 2021-02-07 DIAGNOSIS — Z0000 Encounter for general adult medical examination without abnormal findings: Secondary | ICD-10-CM | POA: Insufficient documentation

## 2021-02-07 DIAGNOSIS — Z23 Encounter for immunization: Secondary | ICD-10-CM | POA: Diagnosis not present

## 2021-02-07 DIAGNOSIS — F32A Depression, unspecified: Secondary | ICD-10-CM

## 2021-02-07 DIAGNOSIS — F419 Anxiety disorder, unspecified: Secondary | ICD-10-CM

## 2021-02-07 MED ORDER — FLUOXETINE HCL 40 MG PO CAPS: 40.0000 mg | ORAL_CAPSULE | Freq: Every morning | ORAL | 3 refills | Status: DC

## 2021-02-07 NOTE — Assessment & Plan Note (Signed)
CBE and pap smear performed. Congratulated her on low carbohydrate diet. Encouraged to start walking program. Tdap given today.

## 2021-02-07 NOTE — Assessment & Plan Note (Signed)
Improved however patient would like to increase prozac to see if energy improves. Increase prozac to 40mg .  Continue wellbutrin 300mg .

## 2021-02-07 NOTE — Patient Instructions (Signed)
Increase prozac to 40mg    Health Maintenance, Female Adopting a healthy lifestyle and getting preventive care are important in promoting health and wellness. Ask your health care provider about:  The right schedule for you to have regular tests and exams.  Things you can do on your own to prevent diseases and keep yourself healthy. What should I know about diet, weight, and exercise? Eat a healthy diet  Eat a diet that includes plenty of vegetables, fruits, low-fat dairy products, and lean protein.  Do not eat a lot of foods that are high in solid fats, added sugars, or sodium.   Maintain a healthy weight Body mass index (BMI) is used to identify weight problems. It estimates body fat based on height and weight. Your health care provider can help determine your BMI and help you achieve or maintain a healthy weight. Get regular exercise Get regular exercise. This is one of the most important things you can do for your health. Most adults should:  Exercise for at least 150 minutes each week. The exercise should increase your heart rate and make you sweat (moderate-intensity exercise).  Do strengthening exercises at least twice a week. This is in addition to the moderate-intensity exercise.  Spend less time sitting. Even light physical activity can be beneficial. Watch cholesterol and blood lipids Have your blood tested for lipids and cholesterol at 44 years of age, then have this test every 5 years. Have your cholesterol levels checked more often if:  Your lipid or cholesterol levels are high.  You are older than 44 years of age.  You are at high risk for heart disease. What should I know about cancer screening? Depending on your health history and family history, you may need to have cancer screening at various ages. This may include screening for:  Breast cancer.  Cervical cancer.  Colorectal cancer.  Skin cancer.  Lung cancer. What should I know about heart disease,  diabetes, and high blood pressure? Blood pressure and heart disease  High blood pressure causes heart disease and increases the risk of stroke. This is more likely to develop in people who have high blood pressure readings, are of African descent, or are overweight.  Have your blood pressure checked: ? Every 3-5 years if you are 26-35 years of age. ? Every year if you are 65 years old or older. Diabetes Have regular diabetes screenings. This checks your fasting blood sugar level. Have the screening done:  Once every three years after age 60 if you are at a normal weight and have a low risk for diabetes.  More often and at a younger age if you are overweight or have a high risk for diabetes. What should I know about preventing infection? Hepatitis B If you have a higher risk for hepatitis B, you should be screened for this virus. Talk with your health care provider to find out if you are at risk for hepatitis B infection. Hepatitis C Testing is recommended for:  Everyone born from [birth year] through 1964.  Anyone with known risk factors for hepatitis C. Sexually transmitted infections (STIs)  Get screened for STIs, including gonorrhea and chlamydia, if: ? You are sexually active and are younger than 44 years of age. ? You are older than 44 years of age and your health care provider tells you that you are at risk for this type of infection. ? Your sexual activity has changed since you were last screened, and you are at increased risk for chlamydia or  gonorrhea. Ask your health care provider if you are at risk.  Ask your health care provider about whether you are at high risk for HIV. Your health care provider may recommend a prescription medicine to help prevent HIV infection. If you choose to take medicine to prevent HIV, you should first get tested for HIV. You should then be tested every 3 months for as long as you are taking the medicine. Pregnancy  If you are about to stop having your  period (premenopausal) and you may become pregnant, seek counseling before you get pregnant.  Take 400 to 800 micrograms (mcg) of folic acid every day if you become pregnant.  Ask for birth control (contraception) if you want to prevent pregnancy. Osteoporosis and menopause Osteoporosis is a disease in which the bones lose minerals and strength with aging. This can result in bone fractures. If you are 81 years old or older, or if you are at risk for osteoporosis and fractures, ask your health care provider if you should:  Be screened for bone loss.  Take a calcium or vitamin D supplement to lower your risk of fractures.  Be given hormone replacement therapy (HRT) to treat symptoms of menopause. Follow these instructions at home: Lifestyle  Do not use any products that contain nicotine or tobacco, such as cigarettes, e-cigarettes, and chewing tobacco. If you need help quitting, ask your health care provider.  Do not use street drugs.  Do not share needles.  Ask your health care provider for help if you need support or information about quitting drugs. Alcohol use  Do not drink alcohol if: ? Your health care provider tells you not to drink. ? You are pregnant, may be pregnant, or are planning to become pregnant.  If you drink alcohol: ? Limit how much you use to 0-1 drink a day. ? Limit intake if you are breastfeeding.  Be aware of how much alcohol is in your drink. In the [place] one drink equals one 12 oz bottle of beer (355 mL), one 5 oz glass of wine (148 mL), or one 1 oz glass of hard liquor (44 mL). General instructions  Schedule regular health, dental, and eye exams.  Stay current with your vaccines.  Tell your health care provider if: ? You often feel depressed. ? You have ever been abused or do not feel safe at home. Summary  Adopting a healthy lifestyle and getting preventive care are important in promoting health and wellness.  Follow your health care provider's  instructions about healthy diet, exercising, and getting tested or screened for diseases.  Follow your health care provider's instructions on monitoring your cholesterol and blood pressure. This information is not intended to replace advice given to you by your health care provider. Make sure you discuss any questions you have with your health care provider. Document Revised: 11/30/2018 Document Reviewed: 11/30/2018 Elsevier Patient Education  2021 [employer].

## 2021-02-07 NOTE — Progress Notes (Signed)
Subjective:    Patient ID: Enrique Sack, female    DOB: 1976-04-23, 45 y.o.   MRN: [id number]  CC: Ajayla Iglesias is a 45 y.o. female who presents today for physical exam.    HPI: Feels well today  No new complaints  Depression- compliant with prozac 20mg  and wellbutrin 300mg  and feels well on this new regimen. She would like to increase prozac to if continued improvement.  Fatigue has improved slightly. Sleeping well.   Prediabetes- she no longer drinks any drinks with sugar. Following low carb keto. She has lost 3 lbs.   Low b12, vitamin d.  She thinks b12 may be helping with energy as well.    Colorectal Cancer Screening: no early family history Breast Cancer Screening: Mammogram scheduled Cervical Cancer Screening: due           Tetanus - due        Labs: Screening labs done prior Exercise: No formal exercise.   Alcohol use:  occassional Smoking/tobacco use: former smoker.     HISTORY:  Past Medical History:  Diagnosis Date  . ADD (attention deficit disorder with hyperactivity)   . Allergy   . Depression     Past Surgical History:  Procedure Laterality Date  . CESAREAN SECTION     x3  . FOOT SURGERY     pin removal  . TUBAL LIGATION     Family History  Problem Relation Age of Onset  . Stroke Father   . Stroke Maternal Aunt   . Diabetes Maternal Aunt   . Stroke Maternal Grandfather   . Stroke Paternal Grandfather   . Breast cancer Neg Hx   . Colon cancer Neg Hx       ALLERGIES: Beef-derived products, Erythromycin, and Pork-derived products  Current Outpatient Medications on File Prior to Visit  Medication Sig Dispense Refill  . B-D 3CC LUER-LOK SYR 25GX1" 25G X 1" 3 ML MISC USED TO GIVE MONTHLY B-12 INJECTIONS.    B-D 3CC LUER-LOK SYR 25GX5/8" 25G X 5/8" 3 ML MISC USED TO GIVE MONTHLY B-12 INJECTIONS.    Marland Kitchen buPROPion (WELLBUTRIN XL) 300 MG 24 hr tablet TAKE 1 TABLET BY MOUTH EVERY DAY 30 tablet 1  . cyanocobalamin (,VITAMIN  B-12,) 1000 MCG/ML injection Inject 1000 mcg into the skin IM 1x a week for 4 weeks & 1x monthly thereafter. 10 mL 2  . EPINEPHrine 0.3 mg/0.3 mL IJ SOAJ injection Inject 0.3 mLs (0.3 mg total) into the muscle as needed for anaphylaxis. 1 each 2  . NEEDLE, DISP, 25 G (B-D DISP NEEDLE 25GX1") 25G X 1" MISC Used to give monthly B-12 injections. 50 each 2  . sertraline (ZOLOFT) 100 MG tablet Take 100 mg by mouth daily.    . SYRINGE/NEEDLE, DISP, 1 ML (BD ECLIPSE SYRINGE) 25G X 5/8" 1 ML MISC Used to give monthly B-12 injections. 50 each 2   No current facility-administered medications on file prior to visit.    Social History   Tobacco Use  . Smoking status: Former Marland Kitchen  . Smokeless tobacco: Never Used  . Tobacco comment: quit 2020  Substance Use Topics  . Alcohol use: Yes    Comment: occasional  . Drug use: No    Review of Systems  Constitutional: Negative for chills, fever and unexpected weight change.  HENT: Negative for congestion.   Respiratory: Negative for cough.   Cardiovascular: Negative for chest pain, palpitations and leg swelling.  Gastrointestinal: Negative for nausea and vomiting.  Genitourinary: Negative for pelvic pain.  Musculoskeletal: Negative for arthralgias and myalgias.  Skin: Negative for rash.  Neurological: Negative for headaches.  Hematological: Negative for adenopathy.  Psychiatric/Behavioral: Negative for confusion and sleep disturbance.      Objective:    BP 122/90 (BP Location: Left Arm, Patient Position: Sitting)   Pulse 95   Temp 98.7 F (37.1 C)   Ht 5' 4.02" (1.626 m)   Wt 227 lb 6.4 oz (103.1 kg)   SpO2 98%   BMI 39.01 kg/m   BP Readings from Last 3 Encounters:  02/07/21 122/90  12/18/20 138/88  03/13/19 120/78   Wt Readings from Last 3 Encounters:  02/07/21 227 lb 6.4 oz (103.1 kg)  12/18/20 230 lb 9.6 oz (104.6 kg)  09/13/20 220 lb (99.8 kg)    Physical Exam Vitals reviewed.  Constitutional:      Appearance: She is  well-developed and well-nourished.  Eyes:     Conjunctiva/sclera: Conjunctivae normal.  Neck:     Thyroid: No thyroid mass or thyromegaly.  Cardiovascular:     Rate and Rhythm: Normal rate and regular rhythm.     Pulses: Normal pulses.     Heart sounds: Normal heart sounds.  Pulmonary:     Effort: Pulmonary effort is normal.     Breath sounds: Normal breath sounds. No wheezing, rhonchi or rales.     Comments: No masses or asymmetry appreciated during CBE. Chest:  Breasts: Breasts are symmetrical.     Right: No inverted nipple, mass, nipple discharge, skin change or tenderness.     Left: No inverted nipple, mass, nipple discharge, skin change or tenderness.    Genitourinary:    Cervix: No cervical motion tenderness, discharge or friability.     Uterus: Not enlarged, not fixed and not tender.      Adnexa:        Right: No mass, tenderness or fullness.         Left: No mass, tenderness or fullness.       Comments: Pap performed. No CMT. Unable to appreciated ovaries. Lymphadenopathy:     Head:     Right side of head: No submental, submandibular, tonsillar, preauricular, posterior auricular or occipital adenopathy.     Left side of head: No submental, submandibular, tonsillar, preauricular, posterior auricular or occipital adenopathy.     Cervical:     Right cervical: No superficial, deep or posterior cervical adenopathy.    Left cervical: No superficial, deep or posterior cervical adenopathy.     Upper Body:  No axillary adenopathy present.    Right upper body: No pectoral or lateral adenopathy.     Left upper body: No pectoral or lateral adenopathy.  Skin:    General: Skin is warm and dry.  Neurological:     Mental Status: She is alert.  Psychiatric:        Mood and Affect: Mood and affect normal.        Speech: Speech normal.        Behavior: Behavior normal.        Thought Content: Thought content normal.        Assessment & Plan:   Problem List Items Addressed  This Visit      Other   Annual physical exam - Primary    CBE and pap smear performed. Congratulated her on low carbohydrate diet. Encouraged to start walking program. Tdap given today.      Relevant Orders   Cytology - PAP  Tdap vaccine greater than or equal to 7yo IM (Completed)   Anxiety and depression    Improved however patient would like to increase prozac to see if energy improves. Increase prozac to 40mg .  Continue wellbutrin 300mg .      Relevant Medications   sertraline (ZOLOFT) 100 MG tablet   FLUoxetine (PROZAC) 40 MG capsule       I have changed . Behrle's FLUoxetine. I am also having her maintain her EPINEPHrine, cyanocobalamin, BD Eclipse Syringe, B-D DISP NEEDLE 25GX1", buPROPion, sertraline, B-D 3CC LUER-LOK SYR 25GX1", and B-D 3CC LUER-LOK SYR 25GX5/8".   Meds ordered this encounter  Medications  . FLUoxetine (PROZAC) 40 MG capsule    Sig: Take 1 capsule (40 mg total) by mouth every morning.    Dispense:  90 capsule    Refill:  3    Order Specific Question:   Supervising Provider    Answer:   [[id number]]    Return precautions given.   Risks, benefits, and alternatives of the medications and treatment plan prescribed today were discussed, and patient expressed understanding.   Education regarding symptom management and diagnosis given to patient on AVS.   Continue to follow with Joelene Millin, FNP for routine health maintenance.   Sherlene Shams and I agreed with plan.   Allegra Grana, FNP

## 2021-02-10 LAB — CYTOLOGY - PAP
Comment: NEGATIVE
Diagnosis: NEGATIVE
High risk HPV: NEGATIVE

## 2021-02-26 ENCOUNTER — Other Ambulatory Visit: Payer: Self-pay | Admitting: Family

## 2021-02-26 DIAGNOSIS — F32A Depression, unspecified: Secondary | ICD-10-CM

## 2021-02-27 ENCOUNTER — Other Ambulatory Visit: Payer: Self-pay

## 2021-02-27 ENCOUNTER — Ambulatory Visit
Admission: RE | Admit: 2021-02-27 | Discharge: 2021-02-27 | Disposition: A | Discharge status: Home / Self Care | Payer: BC Managed Care – PPO | Source: Ambulatory Visit | Attending: Family | Admitting: Family

## 2021-02-27 DIAGNOSIS — Z1231 Encounter for screening mammogram for malignant neoplasm of breast: Secondary | ICD-10-CM | POA: Insufficient documentation

## 2021-03-03 ENCOUNTER — Other Ambulatory Visit: Payer: Self-pay | Admitting: Family

## 2021-03-03 ENCOUNTER — Telehealth: Payer: Self-pay

## 2021-03-03 DIAGNOSIS — R928 Other abnormal and inconclusive findings on diagnostic imaging of breast: Secondary | ICD-10-CM

## 2021-03-03 DIAGNOSIS — N6489 Other specified disorders of breast: Secondary | ICD-10-CM

## 2021-03-03 NOTE — Telephone Encounter (Signed)
LMTCB for mammogram results.  

## 2021-03-09 ENCOUNTER — Encounter: Payer: Self-pay | Admitting: Family

## 2021-03-09 DIAGNOSIS — F32A Depression, unspecified: Secondary | ICD-10-CM

## 2021-03-10 ENCOUNTER — Other Ambulatory Visit: Payer: Self-pay | Admitting: Family

## 2021-03-17 ENCOUNTER — Ambulatory Visit
Admission: RE | Admit: 2021-03-17 | Discharge: 2021-03-17 | Disposition: A | Discharge status: Home / Self Care | Payer: BC Managed Care – PPO | Source: Ambulatory Visit | Attending: Family | Admitting: Family

## 2021-03-17 ENCOUNTER — Other Ambulatory Visit: Payer: Self-pay | Admitting: Family

## 2021-03-17 ENCOUNTER — Other Ambulatory Visit: Payer: Self-pay

## 2021-03-17 DIAGNOSIS — N6489 Other specified disorders of breast: Secondary | ICD-10-CM

## 2021-03-17 DIAGNOSIS — R928 Other abnormal and inconclusive findings on diagnostic imaging of breast: Secondary | ICD-10-CM

## 2021-03-17 DIAGNOSIS — R922 Inconclusive mammogram: Secondary | ICD-10-CM | POA: Diagnosis not present

## 2021-03-21 ENCOUNTER — Encounter: Payer: Self-pay | Admitting: Family

## 2021-03-24 ENCOUNTER — Other Ambulatory Visit: Payer: Self-pay

## 2021-03-27 ENCOUNTER — Ambulatory Visit
Admission: RE | Admit: 2021-03-27 | Discharge: 2021-03-27 | Disposition: A | Discharge status: Home / Self Care | Payer: BC Managed Care – PPO | Source: Ambulatory Visit | Attending: Family | Admitting: Family

## 2021-03-27 ENCOUNTER — Other Ambulatory Visit: Payer: Self-pay

## 2021-03-27 DIAGNOSIS — N6489 Other specified disorders of breast: Secondary | ICD-10-CM | POA: Diagnosis not present

## 2021-03-27 DIAGNOSIS — R928 Other abnormal and inconclusive findings on diagnostic imaging of breast: Secondary | ICD-10-CM | POA: Insufficient documentation

## 2021-03-27 HISTORY — PX: BREAST BIOPSY: SHX20

## 2021-03-28 LAB — SURGICAL PATHOLOGY

## 2021-03-31 ENCOUNTER — Telehealth: Payer: Self-pay | Admitting: Family

## 2021-03-31 ENCOUNTER — Other Ambulatory Visit: Payer: Self-pay | Admitting: Family

## 2021-03-31 NOTE — Telephone Encounter (Signed)
Call pt I can see biopsy results from [facility] and recommendation for Patient to return in six months for unilateral LEFT breast diagnostic mammogram and possible ultrasound to document stability of biopsied area.  Per note, a reminder notice will be sent regarding this appointment and she will need to call mammography site to schedule this appointment.  Please dont hesistate to call me in 6 months time if she needs order or anything from me.

## 2021-03-31 NOTE — Telephone Encounter (Signed)
LMTCB

## 2021-04-07 MED ORDER — BUPROPION HCL ER (XL) 300 MG PO TB24: 1.0000 | ORAL_TABLET | Freq: Every day | ORAL | 1 refills | Status: DC

## 2021-04-14 NOTE — Telephone Encounter (Signed)
Spoke with pt She stated she was aware of benign left breast pathology and will cal [place] 08/2021 to ask for diagnostic images of left breast to be ordered

## 2021-04-21 ENCOUNTER — Ambulatory Visit: Payer: BC Managed Care – PPO | Admitting: Family

## 2021-04-22 ENCOUNTER — Encounter: Payer: Self-pay | Admitting: Family

## 2021-04-22 ENCOUNTER — Telehealth (INDEPENDENT_AMBULATORY_CARE_PROVIDER_SITE_OTHER): Payer: BC Managed Care – PPO | Admitting: Family

## 2021-04-22 VITALS — Ht 64.02 in | Wt 227.0 lb

## 2021-04-22 DIAGNOSIS — E669 Obesity, unspecified: Secondary | ICD-10-CM

## 2021-04-22 DIAGNOSIS — E538 Deficiency of other specified B group vitamins: Secondary | ICD-10-CM

## 2021-04-22 DIAGNOSIS — E559 Vitamin D deficiency, unspecified: Secondary | ICD-10-CM

## 2021-04-22 DIAGNOSIS — R5383 Other fatigue: Secondary | ICD-10-CM | POA: Diagnosis not present

## 2021-04-22 DIAGNOSIS — Z6839 Body mass index (BMI) 39.0-39.9, adult: Secondary | ICD-10-CM

## 2021-04-22 MED ORDER — OZEMPIC (0.25 OR 0.5 MG/DOSE) 2 MG/1.5ML ~~LOC~~ SOPN: 0.2500 mg | PEN_INJECTOR | SUBCUTANEOUS | 3 refills | Status: DC

## 2021-04-22 NOTE — Patient Instructions (Signed)
Start ozempic 0.25mg once per week After 4 weeks, and if tolerated, please increase to 0.5mg   Please read information on medication below and remember black box warning that you may not take if you or a family member is diagnosed with thyroid cancer.    Semaglutide injection solution What is this medicine? SEMAGLUTIDE (Sem a GLOO tide) is used to improve blood sugar control in adults with type 2 diabetes. This medicine may be used with other diabetes medicines. This drug may also reduce the risk of heart attack or stroke if you have type 2 diabetes and risk factors for heart disease. This medicine may be used for other purposes; ask your health care provider or pharmacist if you have questions. COMMON BRAND NAME(S): OZEMPIC What should I tell my health care provider before I take this medicine? They need to know if you have any of these conditions:  endocrine tumors (MEN 2) or if someone in your family had these tumors  eye disease, vision problems  history of pancreatitis  kidney disease  stomach problems  thyroid cancer or if someone in your family had thyroid cancer  an unusual or allergic reaction to semaglutide, other medicines, foods, dyes, or preservatives  pregnant or trying to get pregnant  breast-feeding How should I use this medicine? This medicine is for injection under the skin of your upper leg (thigh), stomach area, or upper arm. It is given once every week (every 7 days). You will be taught how to prepare and give this medicine. Use exactly as directed. Take your medicine at regular intervals. Do not take it more often than directed. If you use this medicine with insulin, you should inject this medicine and the insulin separately. Do not mix them together. Do not give the injections right next to each other. Change (rotate) injection sites with each injection. It is important that you put your used needles and syringes in a special sharps container. Do not put them  in a trash can. If you do not have a sharps container, call your pharmacist or healthcare provider to get one. A special MedGuide will be given to you by the pharmacist with each prescription and refill. Be sure to read this information carefully each time. This drug comes with INSTRUCTIONS FOR USE. Ask your pharmacist for directions on how to use this drug. Read the information carefully. Talk to your pharmacist or health care provider if you have questions. Talk to your pediatrician regarding the use of this medicine in children. Special care may be needed. Overdosage: If you think you have taken too much of this medicine contact a poison control center or emergency room at once. NOTE: This medicine is only for you. Do not share this medicine with others. What if I miss a dose? If you miss a dose, take it as soon as you can within 5 days after the missed dose. Then take your next dose at your regular weekly time. If it has been longer than 5 days after the missed dose, do not take the missed dose. Take the next dose at your regular time. Do not take double or extra doses. If you have questions about a missed dose, contact your health care provider for advice. What may interact with this medicine?  other medicines for diabetes Many medications may cause changes in blood sugar, these include:  alcohol containing beverages  antiviral medicines for HIV or AIDS  aspirin and aspirin-like drugs  certain medicines for blood pressure, heart disease, irregular   heart beat  chromium  diuretics  female hormones, such as estrogens or progestins, birth control pills  fenofibrate  gemfibrozil  isoniazid  lanreotide  female hormones or anabolic steroids  MAOIs like Carbex, Eldepryl, Marplan, Nardil, and Parnate  medicines for weight loss  medicines for allergies, asthma, cold, or cough  medicines for depression, anxiety, or psychotic disturbances  niacin  nicotine  NSAIDs, medicines  for pain and inflammation, like ibuprofen or naproxen  octreotide  pasireotide  pentamidine  phenytoin  probenecid  quinolone antibiotics such as ciprofloxacin, levofloxacin, ofloxacin  some herbal dietary supplements  steroid medicines such as prednisone or cortisone  sulfamethoxazole; trimethoprim  thyroid hormones Some medications can hide the warning symptoms of low blood sugar (hypoglycemia). You may need to monitor your blood sugar more closely if you are taking one of these medications. These include:  beta-blockers, often used for high blood pressure or heart problems (examples include atenolol, metoprolol, propranolol)  clonidine  guanethidine  reserpine This list may not describe all possible interactions. Give your health care provider a list of all the medicines, herbs, non-prescription drugs, or dietary supplements you use. Also tell them if you smoke, drink alcohol, or use illegal drugs. Some items may interact with your medicine. What should I watch for while using this medicine? Visit your doctor or health care professional for regular checks on your progress. Drink plenty of fluids while taking this medicine. Check with your doctor or health care professional if you get an attack of severe diarrhea, nausea, and vomiting. The loss of too much body fluid can make it dangerous for you to take this medicine. A test called the HbA1C (A1C) will be monitored. This is a simple blood test. It measures your blood sugar control over the last 2 to 3 months. You will receive this test every 3 to 6 months. Learn how to check your blood sugar. Learn the symptoms of low and high blood sugar and how to manage them. Always carry a quick-source of sugar with you in case you have symptoms of low blood sugar. Examples include hard sugar candy or glucose tablets. Make sure others know that you can choke if you eat or drink when you develop serious symptoms of low blood sugar, such as  seizures or unconsciousness. They must get medical help at once. Tell your doctor or health care professional if you have high blood sugar. You might need to change the dose of your medicine. If you are sick or exercising more than usual, you might need to change the dose of your medicine. Do not skip meals. Ask your doctor or health care professional if you should avoid alcohol. Many nonprescription cough and cold products contain sugar or alcohol. These can affect blood sugar. Pens should never be shared. Even if the needle is changed, sharing may result in passing of viruses like hepatitis or HIV. Wear a medical ID bracelet or chain, and carry a card that describes your disease and details of your medicine and dosage times. Do not become pregnant while taking this medicine. Women should inform their doctor if they wish to become pregnant or think they might be pregnant. There is a potential for serious side effects to an unborn child. Talk to your health care professional or pharmacist for more information. What side effects may I notice from receiving this medicine? Side effects that you should report to your doctor or health care professional as soon as possible:  allergic reactions like skin rash, itching or   hives, swelling of the face, lips, or tongue  breathing problems  changes in vision  diarrhea that continues or is severe  lump or swelling on the neck  severe nausea  signs and symptoms of infection like fever or chills; cough; sore throat; pain or trouble passing urine  signs and symptoms of low blood sugar such as feeling anxious, confusion, dizziness, increased hunger, unusually weak or tired, sweating, shakiness, cold, irritable, headache, blurred vision, fast heartbeat, loss of consciousness  signs and symptoms of kidney injury like trouble passing urine or change in the amount of urine  trouble swallowing  unusual stomach upset or pain  vomiting Side effects that  usually do not require medical attention (report to your doctor or health care professional if they continue or are bothersome):  constipation  diarrhea  nausea  pain, redness, or irritation at site where injected  stomach upset This list may not describe all possible side effects. Call your doctor for medical advice about side effects. You may report side effects to FDA at 1-800-FDA-1088. Where should I keep my medicine? Keep out of the reach of children. Store unopened pens in a refrigerator between 2 and 8 degrees C (36 and 46 degrees F). Do not freeze. Protect from light and heat. After you first use the pen, it can be stored for 56 days at room temperature between 15 and 30 degrees C (59 and 86 degrees F) or in a refrigerator. Throw away your used pen after 56 days or after the expiration date, whichever comes first. Do not store your pen with the needle attached. If the needle is left on, medicine may leak from the pen. NOTE: This sheet is a summary. It may not cover all possible information. If you have questions about this medicine, talk to your doctor, pharmacist, or health care provider.  2021 Elsevier/Gold Standard (2019-08-22 09:41:51)  

## 2021-04-22 NOTE — Progress Notes (Signed)
Virtual Visit via Video Note  I connected with@  on 04/23/21 at  1:30 PM EDT by a video enabled telemedicine application and verified that I am speaking with the correct person using two identifiers.  Location patient: home Location provider:work  Persons participating in the virtual visit: patient, provider  I discussed the limitations of evaluation and management by telemedicine and the availability of in person appointments. The patient expressed understanding and agreed to proceed.   HPI: Fatigue has improved slightly. She is compliant with b12 , compliant with vitamin d.   Anxiety and depression- compliant with wellbutrin 300mg  , prozac 40mg   No trouble swallowing , pain in neck . No personal or family h/o thyroid   TSH normal 11/2020   03/31/21 Breast biopsy negative for malignancy.    ROS: See pertinent positives and negatives per HPI.    EXAM:  VITALS per patient if applicable: Ht 5' 4.02" (1.626 m)   Wt 227 lb (103 kg)   LMP 03/26/2021   BMI 38.94 kg/m  BP Readings from Last 3 Encounters:  02/07/21 122/90  12/18/20 138/88  03/13/19 120/78   Wt Readings from Last 3 Encounters:  04/22/21 227 lb (103 kg)  02/07/21 227 lb 6.4 oz (103.1 kg)  12/18/20 230 lb 9.6 oz (104.6 kg)    GENERAL: alert, oriented, appears well and in no acute distress  HEENT: atraumatic, conjunttiva clear, no obvious abnormalities on inspection of external nose and ears  NECK: normal movements of the head and neck  LUNGS: on inspection no signs of respiratory distress, breathing rate appears normal, no obvious gross SOB, gasping or wheezing  CV: no obvious cyanosis  MS: moves all visible extremities without noticeable abnormality  PSYCH/NEURO: pleasant and cooperative, no obvious depression or anxiety, speech and thought processing grossly intact  ASSESSMENT AND PLAN:  Discussed the following assessment and plan:  Problem List Items Addressed This Visit      Other   Fatigue     Slight improvement. Etiology unclear. Pending further labs. Advised to make follow up if remains persistent of new symptoms develop so we can discuss further evaluation.       Obesity    Trial of ozempic. She will increase in 4 weeks time to 0.5mg . Discussed at great length black box warning regarding medullary thyroid cancer and she verbalized understanding and she is comfortable with starting medication       Relevant Medications   Semaglutide,0.25 or 0.5MG /DOS, (OZEMPIC, 0.25 OR 0.5 MG/DOSE,) 2 MG/1.5ML SOPN    Other Visit Diagnoses    Vitamin D deficiency    -  Primary   Relevant Orders   VITAMIN D 25 Hydroxy (Vit-D Deficiency, Fractures)   B12 deficiency       Relevant Orders   B12 and Folate Panel   Intrinsic Factor Antibodies   Methylmalonic acid, serum   Anti-parietal antibody      -we discussed possible serious and likely etiologies, options for evaluation and workup, limitations of telemedicine visit vs in person visit, treatment, treatment risks and precautions. Pt prefers to treat via telemedicine empirically rather then risking or undertaking an in person visit at this moment.  .   I discussed the assessment and treatment plan with the patient. The patient was provided an opportunity to ask questions and all were answered. The patient agreed with the plan and demonstrated an understanding of the instructions.   The patient was advised to call back or seek an in-person evaluation if the symptoms worsen  or if the condition fails to improve as anticipated.   Mable Paris, FNP

## 2021-04-23 ENCOUNTER — Encounter: Payer: Self-pay | Admitting: Family

## 2021-04-23 DIAGNOSIS — R5383 Other fatigue: Secondary | ICD-10-CM | POA: Insufficient documentation

## 2021-04-23 NOTE — Assessment & Plan Note (Signed)
Slight improvement. Etiology unclear. Pending further labs. Advised to make follow up if remains persistent of new symptoms develop so we can discuss further evaluation.

## 2021-04-23 NOTE — Assessment & Plan Note (Signed)
Trial of ozempic. She will increase in 4 weeks time to 0.5mg . Discussed at great length black box warning regarding medullary thyroid cancer and she verbalized understanding and she is comfortable with starting medication

## 2021-07-28 ENCOUNTER — Other Ambulatory Visit: Payer: Self-pay | Admitting: Family

## 2021-07-28 DIAGNOSIS — Z6839 Body mass index (BMI) 39.0-39.9, adult: Secondary | ICD-10-CM

## 2021-07-28 DIAGNOSIS — E669 Obesity, unspecified: Secondary | ICD-10-CM

## 2021-09-14 ENCOUNTER — Other Ambulatory Visit: Payer: Self-pay | Admitting: Family

## 2021-09-14 DIAGNOSIS — F32A Depression, unspecified: Secondary | ICD-10-CM

## 2021-11-22 ENCOUNTER — Other Ambulatory Visit: Payer: Self-pay | Admitting: Family

## 2021-11-22 DIAGNOSIS — E669 Obesity, unspecified: Secondary | ICD-10-CM

## 2021-12-23 ENCOUNTER — Encounter: Payer: Self-pay | Admitting: Family

## 2021-12-24 ENCOUNTER — Other Ambulatory Visit: Payer: Self-pay

## 2021-12-24 ENCOUNTER — Telehealth: Payer: Self-pay | Admitting: Pharmacist

## 2021-12-24 MED ORDER — OZEMPIC (1 MG/DOSE) 4 MG/3ML ~~LOC~~ SOPN: 1.0000 mg | PEN_INJECTOR | SUBCUTANEOUS | 1 refills | Status: DC

## 2021-12-24 NOTE — Telephone Encounter (Signed)
Received PA via Cover My Meds for Ozempic. Completed ([employer] [id number]). Unlikely this will be approved as patient does not have a diagnosis of T2DM and has not tried and failed metformin therapy.   Will follow for result. Consider [id number].

## 2021-12-28 ENCOUNTER — Other Ambulatory Visit: Payer: Self-pay | Admitting: Family

## 2021-12-29 NOTE — Telephone Encounter (Signed)
Call pt  Please advise her that per pharmacy ozempic is denied  She can make an appt to schedule discussion regarding alternative medications as last seen 04/2021  Please advise in person

## 2021-12-29 NOTE — Telephone Encounter (Signed)
I have spoken with patient & she was willing to move up follow-up appointment to discuss. Scheduled 01/23/22.

## 2021-12-29 NOTE — Telephone Encounter (Signed)
PA for Ozempic denied, as "FDA has not approved this medication for your condition".   Routing to prescriber for FYI. Recommend trying coverage for [facility].

## 2022-01-23 ENCOUNTER — Encounter: Payer: Self-pay | Admitting: Family

## 2022-01-23 ENCOUNTER — Ambulatory Visit (INDEPENDENT_AMBULATORY_CARE_PROVIDER_SITE_OTHER): Payer: BC Managed Care – PPO | Admitting: Family

## 2022-01-23 ENCOUNTER — Other Ambulatory Visit: Payer: Self-pay

## 2022-01-23 VITALS — BP 122/85 | HR 94 | Temp 98.6°F | Resp 16 | Ht 64.02 in | Wt 222.0 lb

## 2022-01-23 DIAGNOSIS — Z6838 Body mass index (BMI) 38.0-38.9, adult: Secondary | ICD-10-CM

## 2022-01-23 DIAGNOSIS — E663 Overweight: Secondary | ICD-10-CM | POA: Diagnosis not present

## 2022-01-23 DIAGNOSIS — E538 Deficiency of other specified B group vitamins: Secondary | ICD-10-CM

## 2022-01-23 DIAGNOSIS — Z1231 Encounter for screening mammogram for malignant neoplasm of breast: Secondary | ICD-10-CM

## 2022-01-23 MED ORDER — WEGOVY 0.25 MG/0.5ML ~~LOC~~ SOAJ: 0.2500 mg | SUBCUTANEOUS | 2 refills | Status: DC

## 2022-01-23 NOTE — Assessment & Plan Note (Signed)
Start [employer].  Counseled on black box warning as it relates to medullary thyroid cancer, multiple endocrine neoplasia as well as common medication side effects. close follow up

## 2022-01-23 NOTE — Patient Instructions (Addendum)
I have ordered diagnostic images of left breast as well as bilateral diagnostic mammogram.    Please call  and schedule your 3D mammogram we discussed.   [facility]  [address]  [place], [place]  [phone]  We have discussed starting non insulin daily injectable medication called Wegovy  which is a glucagon like peptide (GLP 1) agonist and works by delaying gastric emptying and increasing insulin secretion.It is given once per week. Most patients see significant weight loss with this drug class.   You may NOT take either medication if you or your family has history of thyroid, parathyroid, OR adrenal cancer. Please confirm you and your family does NOT have this history as this drug class has black box warning on this medication for that reason.   Advise to follow with directions on prescription and slowly increase from 0.25mg  [place] once per week ;stay here for 4 weeks. You may then increase to 0.5mg  [place] once per week and stay there for 4 weeks.  We can slowly titrate further at follow up with goal of no more than 1-2 lbs weight loss per week.

## 2022-01-23 NOTE — Progress Notes (Addendum)
Subjective:    Patient ID: Taylor Wagner, female    DOB: [birth year], 46 y.o.   MRN: [id number]  CC: Taylor Wagner is a 46 y.o. female who presents today for follow up.   HPI: Here to follow-up and discuss Wegovy.  Ozempic denied by insurance. She is interested in weight loss  She has never been on metformin     No personal or family h/o thyroid disease.    History of B12 deficiency  Due 09/2021 for unilateral LEFT breast diagnostic mammogram and possible ultrasound to document stability of biopsied area  Annual screening mammogram due next month  HISTORY:  Past Medical History:  Diagnosis Date   ADD (attention deficit disorder with hyperactivity)    Allergy    Depression    Past Surgical History:  Procedure Laterality Date   BREAST BIOPSY Left 03/27/2021   stereo bx ribbon clip path pending   CESAREAN SECTION     x3   FOOT SURGERY     pin removal   TUBAL LIGATION     Family History  Problem Relation Age of Onset   Stroke Father    Stroke Maternal Aunt    Diabetes Maternal Aunt    Stroke Maternal Grandfather    Stroke Paternal Grandfather    Breast cancer Neg Hx    Colon cancer Neg Hx    Thyroid cancer Neg Hx     Allergies: Beef-derived products, Erythromycin, and Pork-derived products Current Outpatient Medications on File Prior to Visit  Medication Sig Dispense Refill   B-D 3CC LUER-LOK SYR 25GX1" 25G X 1" 3 ML MISC USED TO GIVE MONTHLY B-12 INJECTIONS.     B-D 3CC LUER-LOK SYR 25GX5/8" 25G X 5/8" 3 ML MISC USED TO GIVE MONTHLY B-12 INJECTIONS.     cyanocobalamin (,VITAMIN B-12,) 1000 MCG/ML injection INJECT 1000 MCG INTO THE MUSCLE ONCE A WEEK FOR 4 WEEKS THEN ONCE MONTHLY THEREAFTER. 6 mL 4   EPINEPHrine 0.3 mg/0.3 mL IJ SOAJ injection Inject 0.3 mLs (0.3 mg total) into the muscle as needed for anaphylaxis. 1 each 2   FLUoxetine (PROZAC) 40 MG capsule Take 1 capsule (40 mg total) by mouth every morning. 90 capsule 3   NEEDLE, DISP, 25  G (B-D DISP NEEDLE 25GX1") 25G X 1" MISC Used to give monthly B-12 injections. 50 each 2   SYRINGE/NEEDLE, DISP, 1 ML (BD ECLIPSE SYRINGE) 25G X 5/8" 1 ML MISC Used to give monthly B-12 injections. 50 each 2   No current facility-administered medications on file prior to visit.    Social History   Tobacco Use   Smoking status: Former   Smokeless tobacco: Never   Tobacco comments:    quit 2020  Substance Use Topics   Alcohol use: Yes    Comment: occasional   Drug use: No    Review of Systems  Constitutional:  Negative for chills and fever.  Respiratory:  Negative for cough.   Cardiovascular:  Negative for chest pain and palpitations.  Gastrointestinal:  Negative for nausea and vomiting.     Objective:    BP 122/85    Pulse 94    Temp 98.6 F (37 C) (Oral)    Resp 16    Ht 5' 4.02" (1.626 m)    Wt 222 lb (100.7 kg)    SpO2 98%    BMI 38.08 kg/m  BP Readings from Last 3 Encounters:  01/23/22 122/85  02/07/21 122/90  12/18/20 138/88   Wt Readings from  Last 3 Encounters:  01/23/22 222 lb (100.7 kg)  04/22/21 227 lb (103 kg)  02/07/21 227 lb 6.4 oz (103.1 kg)    Physical Exam Vitals reviewed.  Constitutional:      Appearance: She is well-developed.  Eyes:     Conjunctiva/sclera: Conjunctivae normal.  Neck:     Thyroid: No thyroid mass, thyromegaly or thyroid tenderness.  Cardiovascular:     Rate and Rhythm: Normal rate and regular rhythm.     Pulses: Normal pulses.     Heart sounds: Normal heart sounds.  Pulmonary:     Effort: Pulmonary effort is normal.     Breath sounds: Normal breath sounds. No wheezing, rhonchi or rales.  Skin:    General: Skin is warm and dry.  Neurological:     Mental Status: She is alert.  Psychiatric:        Speech: Speech normal.        Behavior: Behavior normal.        Thought Content: Thought content normal.       Assessment & Plan:   Problem List Items Addressed This Visit       Other   B12 deficiency    Patient will  continue B12 injections IM at home.  Pending B12 labs, screen for celiac and pernicious anemia      Relevant Orders   Anti-parietal antibody (Completed)   B12 and Folate Panel (Completed)   Homocysteine (Completed)   Intrinsic Factor Antibodies (Completed)   Methylmalonic acid, serum (Completed)   Hemoglobin A1c (Completed)   Comprehensive metabolic panel (Completed)   CBC with Differential/Platelet (Completed)   TSH (Completed)   Lipid panel (Completed)   Celiac Disease Panel (Completed)   VITAMIN D 25 Hydroxy (Vit-D Deficiency, Fractures) (Completed)   Overweight - Primary    Start [phone].  Counseled on black box warning as it relates to medullary thyroid cancer, multiple endocrine neoplasia as well as common medication side effects. close follow up      Relevant Medications   Semaglutide-Weight Management (WEGOVY) 0.25 MG/0.5ML SOAJ   Screening for breast cancer    I have ordered left breast as well as bilateral diagnostic mammogram. Patient is overdue and she will call to schedule imaging at [facility].        Relevant Orders   MM DIAG BREAST TOMO BILATERAL   US BREAST LTD UNI LEFT INC AXILLA     I have discontinued Joelene Millin. Petko's sertraline and Ozempic (1 MG/DOSE). I am also having her start on Wegovy. Additionally, I am having her maintain her EPINEPHrine, BD Eclipse Syringe, B-D DISP NEEDLE 25GX1", B-D 3CC LUER-LOK SYR 25GX1", B-D 3CC LUER-LOK SYR 25GX5/8", FLUoxetine, and cyanocobalamin.   Meds ordered this encounter  Medications   Semaglutide-Weight Management (WEGOVY) 0.25 MG/0.5ML SOAJ    Sig: Inject 0.25 mg into the skin once a week.    Dispense:  2 mL    Refill:  2    Order Specific Question:   Supervising Provider    Answer:   Sherlene Shams [[id number]]    Return precautions given.   Risks, benefits, and alternatives of the medications and treatment plan prescribed today were discussed, and patient expressed understanding.   Education regarding symptom  management and diagnosis given to patient on AVS.  Continue to follow with Allegra Grana, FNP for routine health maintenance.   Taylor Wagner and I agreed with plan.   Rennie Plowman, FNP

## 2022-01-23 NOTE — Assessment & Plan Note (Signed)
Patient will continue B12 injections IM at home.  Pending B12 labs, screen for celiac and pernicious anemia

## 2022-01-26 NOTE — Assessment & Plan Note (Signed)
I have ordered left breast as well as bilateral diagnostic mammogram. Patient is overdue and she will call to schedule imaging at [facility].

## 2022-01-27 LAB — CBC WITH DIFFERENTIAL/PLATELET
Absolute Monocytes: 428 cells/uL (ref 200–950)
Basophils Absolute: 38 cells/uL (ref 0–200)
Basophils Relative: 0.6 %
Eosinophils Absolute: 227 cells/uL (ref 15–500)
Eosinophils Relative: 3.6 %
HCT: 31.8 % — ABNORMAL LOW (ref 35.0–45.0)
Hemoglobin: 10.3 g/dL — ABNORMAL LOW (ref 11.7–15.5)
Lymphs Abs: 2022 cells/uL (ref 850–3900)
MCH: 28 pg (ref 27.0–33.0)
MCHC: 32.4 g/dL (ref 32.0–36.0)
MCV: 86.4 fL (ref 80.0–100.0)
MPV: 9.7 fL (ref 7.5–12.5)
Monocytes Relative: 6.8 %
Neutro Abs: 3585 cells/uL (ref 1500–7800)
Neutrophils Relative %: 56.9 %
Platelets: 420 10*3/uL — ABNORMAL HIGH (ref 140–400)
RBC: 3.68 10*6/uL — ABNORMAL LOW (ref 3.80–5.10)
RDW: 13 % (ref 11.0–15.0)
Total Lymphocyte: 32.1 %
WBC: 6.3 10*3/uL (ref 3.8–10.8)

## 2022-01-27 LAB — CELIAC DISEASE PANEL
(tTG) Ab, IgA: <1 U/mL
(tTG) Ab, IgG: <1 U/mL
Gliadin IgA: <1 U/mL
Gliadin IgG: <1 U/mL
Immunoglobulin A: 314 mg/dL — ABNORMAL HIGH (ref 47–310)

## 2022-01-27 LAB — COMPREHENSIVE METABOLIC PANEL
AG Ratio: 1.5 (calc) (ref 1.0–2.5)
ALT: 16 U/L (ref 6–29)
AST: 17 U/L (ref 10–30)
Albumin: 4.4 g/dL (ref 3.6–5.1)
Alkaline phosphatase (APISO): 65 U/L (ref 31–125)
BUN: 15 mg/dL (ref 7–25)
CO2: 22 mmol/L (ref 20–32)
Calcium: 9.3 mg/dL (ref 8.6–10.2)
Chloride: 103 mmol/L (ref 98–110)
Creat: 0.7 mg/dL (ref 0.50–0.99)
Globulin: 2.9 g/dL (calc) (ref 1.9–3.7)
Glucose, Bld: 79 mg/dL (ref 65–99)
Potassium: 4.1 mmol/L (ref 3.5–5.3)
Sodium: 136 mmol/L (ref 135–146)
Total Bilirubin: 0.3 mg/dL (ref 0.2–1.2)
Total Protein: 7.3 g/dL (ref 6.1–8.1)

## 2022-01-27 LAB — HEMOGLOBIN A1C
Hgb A1c MFr Bld: 5.4 % of total Hgb (ref <5.7)
Mean Plasma Glucose: 108 mg/dL
eAG (mmol/L): 6 mmol/L

## 2022-01-27 LAB — LIPID PANEL
Cholesterol: 173 mg/dL (ref <200)
HDL: 88 mg/dL (ref >=50)
LDL Cholesterol (Calc): 75 mg/dL (calc)
Non-HDL Cholesterol (Calc): 85 mg/dL (calc) (ref <130)
Total CHOL/HDL Ratio: 2 (calc) (ref <5.0)
Triglycerides: 36 mg/dL (ref <150)

## 2022-01-27 LAB — TSH: TSH: 0.96 mIU/L

## 2022-01-27 LAB — B12 AND FOLATE PANEL
Folate: >24 ng/mL
Vitamin B-12: 352 pg/mL (ref 200–1100)

## 2022-01-27 LAB — INTRINSIC FACTOR ANTIBODIES: Intrinsic Factor: NEGATIVE

## 2022-01-27 LAB — ANTI-PARIETAL ANTIBODY: PARIETAL CELL AB SCREEN: NEGATIVE

## 2022-01-27 LAB — METHYLMALONIC ACID, SERUM: Methylmalonic Acid, Quant: 137 nmol/L (ref 87–318)

## 2022-01-27 LAB — HOMOCYSTEINE: Homocysteine: 8.3 umol/L (ref <10.4)

## 2022-01-27 LAB — VITAMIN D 25 HYDROXY (VIT D DEFICIENCY, FRACTURES): Vit D, 25-Hydroxy: 13 ng/mL — ABNORMAL LOW (ref 30–100)

## 2022-01-28 ENCOUNTER — Other Ambulatory Visit: Payer: Self-pay | Admitting: Family

## 2022-01-28 DIAGNOSIS — E559 Vitamin D deficiency, unspecified: Secondary | ICD-10-CM

## 2022-01-28 DIAGNOSIS — D649 Anemia, unspecified: Secondary | ICD-10-CM

## 2022-01-28 MED ORDER — CHOLECALCIFEROL 1.25 MG (50000 UT) PO TABS: ORAL_TABLET | ORAL | 0 refills | Status: DC

## 2022-01-29 ENCOUNTER — Telehealth: Payer: Self-pay

## 2022-01-29 ENCOUNTER — Encounter: Payer: Self-pay | Admitting: Family

## 2022-01-29 NOTE — Telephone Encounter (Signed)
LMTCB for labs. 

## 2022-01-30 ENCOUNTER — Other Ambulatory Visit: Payer: Self-pay

## 2022-02-01 ENCOUNTER — Other Ambulatory Visit: Payer: Self-pay | Admitting: Family

## 2022-02-01 DIAGNOSIS — F32A Depression, unspecified: Secondary | ICD-10-CM

## 2022-02-05 ENCOUNTER — Other Ambulatory Visit: Payer: Self-pay | Admitting: Family

## 2022-02-05 DIAGNOSIS — F32A Depression, unspecified: Secondary | ICD-10-CM

## 2022-02-05 DIAGNOSIS — F419 Anxiety disorder, unspecified: Secondary | ICD-10-CM

## 2022-02-06 ENCOUNTER — Other Ambulatory Visit (INDEPENDENT_AMBULATORY_CARE_PROVIDER_SITE_OTHER): Payer: BC Managed Care – PPO

## 2022-02-06 DIAGNOSIS — D649 Anemia, unspecified: Secondary | ICD-10-CM

## 2022-02-06 LAB — FECAL OCCULT BLOOD, IMMUNOCHEMICAL: Fecal Occult Bld: NEGATIVE

## 2022-02-07 ENCOUNTER — Encounter: Payer: Self-pay | Admitting: Family

## 2022-02-09 ENCOUNTER — Other Ambulatory Visit: Payer: Self-pay | Admitting: Family

## 2022-02-09 ENCOUNTER — Encounter: Payer: BC Managed Care – PPO | Admitting: Family

## 2022-02-09 DIAGNOSIS — D649 Anemia, unspecified: Secondary | ICD-10-CM

## 2022-02-09 DIAGNOSIS — E538 Deficiency of other specified B group vitamins: Secondary | ICD-10-CM

## 2022-02-12 ENCOUNTER — Telehealth: Payer: Self-pay

## 2022-02-12 NOTE — Telephone Encounter (Signed)
CALLED PATIENT NO ANSWER LEFT VOICEMAIL FOR A CALL BACK °Letter sent °

## 2022-03-18 ENCOUNTER — Other Ambulatory Visit: Payer: Self-pay | Admitting: Family

## 2022-03-18 DIAGNOSIS — E669 Obesity, unspecified: Secondary | ICD-10-CM

## 2022-03-19 ENCOUNTER — Other Ambulatory Visit: Payer: Self-pay | Admitting: Family

## 2022-03-19 DIAGNOSIS — E559 Vitamin D deficiency, unspecified: Secondary | ICD-10-CM

## 2022-03-19 NOTE — Telephone Encounter (Signed)
REFILL SENT TO PHARMACY  

## 2022-03-25 ENCOUNTER — Other Ambulatory Visit: Payer: Self-pay | Admitting: Family

## 2022-03-25 DIAGNOSIS — E559 Vitamin D deficiency, unspecified: Secondary | ICD-10-CM

## 2022-04-14 ENCOUNTER — Other Ambulatory Visit: Payer: Self-pay | Admitting: Family

## 2022-04-14 DIAGNOSIS — E669 Obesity, unspecified: Secondary | ICD-10-CM

## 2022-04-22 ENCOUNTER — Ambulatory Visit: Payer: BC Managed Care – PPO | Admitting: Family

## 2022-05-13 ENCOUNTER — Ambulatory Visit (INDEPENDENT_AMBULATORY_CARE_PROVIDER_SITE_OTHER): Payer: BC Managed Care – PPO | Admitting: Family

## 2022-05-13 ENCOUNTER — Ambulatory Visit (INDEPENDENT_AMBULATORY_CARE_PROVIDER_SITE_OTHER): Payer: BC Managed Care – PPO

## 2022-05-13 ENCOUNTER — Encounter: Payer: Self-pay | Admitting: Family

## 2022-05-13 VITALS — BP 110/80 | HR 110 | Temp 98.3°F | Ht 64.0 in | Wt 223.6 lb

## 2022-05-13 DIAGNOSIS — D649 Anemia, unspecified: Secondary | ICD-10-CM

## 2022-05-13 DIAGNOSIS — E663 Overweight: Secondary | ICD-10-CM

## 2022-05-13 DIAGNOSIS — M25512 Pain in left shoulder: Secondary | ICD-10-CM | POA: Diagnosis not present

## 2022-05-13 DIAGNOSIS — F419 Anxiety disorder, unspecified: Secondary | ICD-10-CM

## 2022-05-13 DIAGNOSIS — F32A Depression, unspecified: Secondary | ICD-10-CM

## 2022-05-13 DIAGNOSIS — M25552 Pain in left hip: Secondary | ICD-10-CM

## 2022-05-13 DIAGNOSIS — E669 Obesity, unspecified: Secondary | ICD-10-CM | POA: Diagnosis not present

## 2022-05-13 DIAGNOSIS — M1612 Unilateral primary osteoarthritis, left hip: Secondary | ICD-10-CM | POA: Diagnosis not present

## 2022-05-13 MED ORDER — MELOXICAM 7.5 MG PO TABS: 7.5000 mg | ORAL_TABLET | Freq: Every day | ORAL | 1 refills | Status: DC | PRN

## 2022-05-13 MED ORDER — METFORMIN HCL ER 500 MG PO TB24: 500.0000 mg | ORAL_TABLET | Freq: Every evening | ORAL | 2 refills | Status: DC

## 2022-05-13 MED ORDER — PREDNISONE 10 MG PO TABS: ORAL_TABLET | ORAL | 0 refills | Status: DC

## 2022-05-13 MED ORDER — DULOXETINE HCL 30 MG PO CPEP: 30.0000 mg | ORAL_CAPSULE | Freq: Every day | ORAL | 3 refills | Status: DC

## 2022-05-13 NOTE — Patient Instructions (Addendum)
Please ensure you do schedule with gastroenterology as they have sent you a letter  [phone]  Start prednisone tomorrow as prednisone can interfere with sleep.  It can also make you more irritable and hungrier.  Decrease prozac to 20mg  and after 2-3 days, stop prozac all together and start cymbalta 30mg    Trial of metformin AFTER you have completed prednisone, transitioned to cymbalta  Start metformin XR with one 500mg  tablet at night. After one week, you may increase to two tablets at night ( total of 1000mg ) . The third week, you may take take two tablets at night and one tablet in the morning.  The fourth week, you may take two tablets in the morning ( 1000mg  total) and two tablets at night (1000mg  total). This will bring you to a maximum daily dose of 2000mg /day which is maximum dose. Along the way, if you want to increase more slowly, please do as this medication can cause GI discomfort and loose stools which usually get better with time , however some patients find that they can only tolerate a certain dose and cannot increase to maximum dose.   Hip Bursitis Rehab Ask your health care provider which exercises are safe for you. Do exercises exactly as told by your health care provider and adjust them as directed. It is normal to feel mild stretching, pulling, tightness, or discomfort as you do these exercises. Stop right away if you feel sudden pain or your pain gets worse. Do not begin these exercises until told by your health care provider. Stretching exercise This exercise warms up your muscles and joints and improves the movement and flexibility of your hip. This exercise also helps to relieve pain and stiffness. Iliotibial band stretch An iliotibial band is a strong band of muscle tissue that runs from the outer side of your hip to the outer side of your thigh and knee. Lie on your side with your left / right leg in the top position. Bend your left /  right knee and grab your ankle. Stretch out your bottom arm to help you balance. Slowly bring your knee back so your thigh is slightly behind your body. Slowly lower your knee toward the floor until you feel a gentle stretch on the outside of your left / right thigh. If you do not feel a stretch and your knee will not lower more toward the floor, place the heel of your other foot on top of your knee and pull your knee down toward the floor with your foot. Hold this position for __________ seconds. Slowly return to the starting position. Repeat __________ times. Complete this exercise __________ times a day. Strengthening exercises These exercises build strength and endurance in your hip and pelvis. Endurance is the ability to use your muscles for a long time, even after they get tired. Bridge This exercise strengthens the muscles that move your thigh backward (hip extensors). Lie on your back on a firm surface with your knees bent and your feet flat on the floor. Tighten your buttocks muscles and lift your buttocks off the floor until your trunk is level with your thighs. Do not arch your back. You should feel the muscles working in your buttocks and the back of your thighs. If you do not feel these muscles, slide your feet 1-2 inches (2.5-5 cm) farther away from your buttocks. If this exercise is too easy, try doing it with your arms crossed over your chest. Hold this position for __________ seconds. Slowly  lower your hips to the starting position. Let your muscles relax completely after each repetition. Repeat __________ times. Complete this exercise __________ times a day. Squats This exercise strengthens the muscles in front of your thigh and knee (quadriceps). Stand in front of a table, with your feet and knees pointing straight ahead. You may rest your hands on the table for balance but not for support. Slowly bend your knees and lower your hips like you are going to sit in a chair. Keep  your weight over your heels, not over your toes. Keep your lower legs upright so they are parallel with the table legs. Do not let your hips go lower than your knees. Do not bend lower than told by your health care provider. If your hip pain increases, do not bend as low. Hold the squat position for __________ seconds. Slowly push with your legs to return to standing. Do not use your hands to pull yourself to standing. Repeat __________ times. Complete this exercise __________ times a day. Hip hike  Stand sideways on a bottom step. Stand on your left / right leg with your other foot unsupported next to the step. You can hold on to the railing or wall for balance if needed. Keep your knees straight and your torso square. Then lift your left / right hip up toward the ceiling. Hold this position for __________ seconds. Slowly let your left / right hip lower toward the floor, past the starting position. Your foot should get closer to the floor. Do not lean or bend your knees. Repeat __________ times. Complete this exercise __________ times a day. Single leg stand This exercise increases your balance. Without shoes, stand near a railing or in a doorway. You may hold on to the railing or door frame as needed for balance. Squeeze your left / right buttock muscles, then lift up your other foot. Do not let your left / right hip push out to the side. It is helpful to stand in front of a mirror for this exercise so you can watch your hip. Hold this position for __________ seconds. Repeat __________ times. Complete this exercise __________ times a day. This information is not intended to replace advice given to you by your health care provider. Make sure you discuss any questions you have with your health care provider. Document Revised: 11/19/2021 Document Reviewed: 11/19/2021 Elsevier Patient Education  2023 [employer].

## 2022-05-13 NOTE — Progress Notes (Signed)
Subjective:    Patient ID: Taylor Wagner, female    DOB: [birth year], 46 y.o.   MRN: [id number]  CC: [name] is a 46 y.o. female who presents today for follow up.   HPI: Complains left hip pain and left posterior leg pain  for years, worsening .  She works as Equities trader, standing for 8 hours per day.Difficult to get into car.  Pain is better in the morning, worse in the afternoons. Painful to sleep on left hip  Intermittent numbness in left groin No low back pain, trouble urinating, bowel incontinent, abdominal pain, dysuria. No falls  Aleve with some relief.  Complains of left anterior shoulder pain for several months. Painful to sleep on left shoulder.  Pain with internal rotation.  No swelling , known injury She has tried zoloft in the past.  B12 deficiency-compliant with B12 injections at home H/o anemia- no heavy menstrual cycles.  HISTORY:  Past Medical History:  Diagnosis Date   ADD (attention deficit disorder with hyperactivity)    Allergy    Depression    Past Surgical History:  Procedure Laterality Date   BREAST BIOPSY Left 03/27/2021   stereo bx ribbon clip path pending   CESAREAN SECTION     x3   FOOT SURGERY     pin removal   TUBAL LIGATION     Family History  Problem Relation Age of Onset   Stroke Father    Stroke Maternal Aunt    Diabetes Maternal Aunt    Stroke Maternal Grandfather    Stroke Paternal Grandfather    Breast cancer Neg Hx    Colon cancer Neg Hx    Thyroid cancer Neg Hx     Allergies: Beef-derived products, Erythromycin, and Pork-derived products Current Outpatient Medications on File Prior to Visit  Medication Sig Dispense Refill   B-D 3CC LUER-LOK SYR 25GX1" 25G X 1" 3 ML MISC USED TO GIVE MONTHLY B-12 INJECTIONS.     B-D 3CC LUER-LOK SYR 25GX5/8" 25G X 5/8" 3 ML MISC USED TO GIVE MONTHLY B-12 INJECTIONS.     buPROPion (WELLBUTRIN XL) 300 MG 24 hr tablet TAKE 1 TABLET BY MOUTH EVERY DAY 90 tablet 1    Cholecalciferol (VITAMIN D3) 1.25 MG (50000 UT) CAPS Take 1 capsule by mouth once a week. 12 capsule 0   cyanocobalamin (,VITAMIN B-12,) 1000 MCG/ML injection INJECT 1000 MCG INTO THE MUSCLE ONCE A WEEK FOR 4 WEEKS THEN ONCE MONTHLY THEREAFTER. 6 mL 4   EPINEPHrine 0.3 mg/0.3 mL IJ SOAJ injection Inject 0.3 mLs (0.3 mg total) into the muscle as needed for anaphylaxis. 1 each 2   NEEDLE, DISP, 25 G (B-D DISP NEEDLE 25GX1") 25G X 1" MISC Used to give monthly B-12 injections. 50 each 2   SYRINGE/NEEDLE, DISP, 1 ML (BD ECLIPSE SYRINGE) 25G X 5/8" 1 ML MISC Used to give monthly B-12 injections. 50 each 2   No current facility-administered medications on file prior to visit.    Social History   Tobacco Use   Smoking status: Former   Smokeless tobacco: Never   Tobacco comments:    quit 2020  Substance Use Topics   Alcohol use: Yes    Comment: occasional   Drug use: No    Review of Systems  Constitutional:  Negative for chills and fever.  Respiratory:  Negative for cough.   Cardiovascular:  Negative for chest pain and palpitations.  Gastrointestinal:  Negative for nausea and vomiting.  Genitourinary:  Negative  for difficulty urinating and menstrual problem.  Musculoskeletal:  Positive for arthralgias and back pain.  Neurological:  Positive for numbness.     Objective:    BP 110/80 (BP Location: Right Arm, Patient Position: Sitting, Cuff Size: Normal)   Pulse (!) 110   Temp 98.3 F (36.8 C) (Oral)   Ht 5\' 4"  (1.626 m)   Wt 223 lb 9.6 oz (101.4 kg)   LMP 04/27/2022   SpO2 99%   BMI 38.38 kg/m  BP Readings from Last 3 Encounters:  05/13/22 110/80  01/23/22 122/85  02/07/21 122/90   Wt Readings from Last 3 Encounters:  05/13/22 223 lb 9.6 oz (101.4 kg)  01/23/22 222 lb (100.7 kg)  04/22/21 227 lb (103 kg)    Physical Exam Vitals reviewed.  Constitutional:      Appearance: She is well-developed.  Eyes:     Conjunctiva/sclera: Conjunctivae normal.  Cardiovascular:      Rate and Rhythm: Normal rate and regular rhythm.     Pulses: Normal pulses.     Heart sounds: Normal heart sounds.  Pulmonary:     Effort: Pulmonary effort is normal.     Breath sounds: Normal breath sounds. No wheezing, rhonchi or rales.  Musculoskeletal:     Right shoulder: No swelling. Normal range of motion.     Left shoulder: No swelling, tenderness or bony tenderness. Decreased range of motion.     Lumbar back: No swelling, edema, spasms, tenderness or bony tenderness. Normal range of motion.     Comments: Full range of motion with flexion, tension, lateral side bends. Pain with deep palpation of left trochanter.  No pain, numbness, tingling elicited with single leg raise bilaterally.   Left Shoulder:   No asymmetry of shoulders when comparing right and left.No pain with palpation over glenohumeral joint lines, Cisne joint, AC joint, or bicipital groove. Pain with internal and external rotation. No pain with resisted lateral extension .   Negative active painful arc sign. Negative passive arc ( Neer's). Negative drop arm.  Strength and sensation normal BUE's.   Skin:    General: Skin is warm and dry.  Neurological:     Mental Status: She is alert.     Sensory: No sensory deficit.     Deep Tendon Reflexes:     Reflex Scores:      Patellar reflexes are 2+ on the right side and 2+ on the left side.    Comments: Sensation and strength intact bilateral lower extremities.  Psychiatric:        Speech: Speech normal.        Behavior: Behavior normal.        Thought Content: Thought content normal.       Assessment & Plan:   Problem List Items Addressed This Visit       Other   Anemia    Reiterated the importance of screening colonoscopy, particularly in the setting of anemia.  She will reach back out to gastroenterology to schedule       Relevant Orders   CBC with Differential/Platelet (Completed)   IBC + Ferritin (Completed)   Anxiety and depression    Suboptimal  control.  We will wean off of Prozac and start Cymbalta for additional pain control.  Close follow-up       Relevant Medications   DULoxetine (CYMBALTA) 30 MG capsule   Left hip pain    No radicular symptoms on exam.  Presentation consistent with trochanteric bursitis.  I provided her  with prednisone taper.  We will also transition to Cymbalta for additional pain control.  Consider MRI lumbar, sacral at follow-up       Relevant Medications   predniSONE (DELTASONE) 10 MG tablet   meloxicam (MOBIC) 7.5 MG tablet   Other Relevant Orders   DG Hip Unilat W OR W/O Pelvis 2-3 Views Left (Completed)   Left shoulder pain    Acute on chronic.  Presentation consistent with impingement syndrome and/or rotator cuff pathology.  We will treat initially conservatively with prednisone taper.  If no improvement will arrange consult with orthopedics.       Relevant Medications   DULoxetine (CYMBALTA) 30 MG capsule   Other Relevant Orders   DG Shoulder Left (Completed)   Obesity - Primary   Relevant Medications   metFORMIN (GLUCOPHAGE-XR) 500 MG 24 hr tablet   Overweight    Start Metformin and titrate.  Close follow-up         I have discontinued Joelene MillinKimberly M. Lippmann's Wegovy and FLUoxetine. I am also having her start on DULoxetine, metFORMIN, predniSONE, and meloxicam. Additionally, I am having her maintain her EPINEPHrine, BD Eclipse Syringe, B-D DISP NEEDLE 25GX1", B-D 3CC LUER-LOK SYR 25GX1", B-D 3CC LUER-LOK SYR 25GX5/8", cyanocobalamin, buPROPion, and Vitamin D3.   Meds ordered this encounter  Medications   DULoxetine (CYMBALTA) 30 MG capsule    Sig: Take 1 capsule (30 mg total) by mouth daily.    Dispense:  90 capsule    Refill:  3    Order Specific Question:   Supervising Provider    Answer:   Duncan DullULLO, TERESA L [[id number]]   metFORMIN (GLUCOPHAGE-XR) 500 MG 24 hr tablet    Sig: Take 1 tablet (500 mg total) by mouth every evening.    Dispense:  90 tablet    Refill:  2    Order Specific  Question:   Supervising Provider    Answer:   Duncan DullULLO, TERESA L [[id number]]   predniSONE (DELTASONE) 10 MG tablet    Sig: Take 4 tablets ( total 40 mg) by mouth for 2 days; take 3 tablets ( total 30 mg) by mouth for 2 days; take 2 tablets ( total 20 mg) by mouth for 1 day; take 1 tablet ( total 10 mg) by mouth for 1 day.    Dispense:  17 tablet    Refill:  0    Order Specific Question:   Supervising Provider    Answer:   Duncan DullULLO, TERESA L [[id number]]   meloxicam (MOBIC) 7.5 MG tablet    Sig: Take 1 tablet (7.5 mg total) by mouth daily as needed for pain.    Dispense:  30 tablet    Refill:  1    Order Specific Question:   Supervising Provider    Answer:   Sherlene ShamsULLO, TERESA L [[id number]]    Return precautions given.   Risks, benefits, and alternatives of the medications and treatment plan prescribed today were discussed, and patient expressed understanding.   Education regarding symptom management and diagnosis given to patient on AVS.  Continue to follow with Allegra GranaArnett, Tyrone Balash G, FNP for routine health maintenance.   Taylor SackKimberly McAdams Marti and I agreed with plan.   Rennie PlowmanMargaret Vashon Arch, FNP

## 2022-05-14 LAB — CBC WITH DIFFERENTIAL/PLATELET
Basophils Absolute: 0.1 10*3/uL (ref 0.0–0.1)
Basophils Relative: 0.7 % (ref 0.0–3.0)
Eosinophils Absolute: 0.5 10*3/uL (ref 0.0–0.7)
Eosinophils Relative: 7.3 % — ABNORMAL HIGH (ref 0.0–5.0)
HCT: 34.4 % — ABNORMAL LOW (ref 36.0–46.0)
Hemoglobin: 11 g/dL — ABNORMAL LOW (ref 12.0–15.0)
Lymphocytes Relative: 26.1 % (ref 12.0–46.0)
Lymphs Abs: 1.9 10*3/uL (ref 0.7–4.0)
MCHC: 32.1 g/dL (ref 30.0–36.0)
MCV: 81.1 fl (ref 78.0–100.0)
Monocytes Absolute: 0.7 10*3/uL (ref 0.1–1.0)
Monocytes Relative: 9.5 % (ref 3.0–12.0)
Neutro Abs: 4.1 10*3/uL (ref 1.4–7.7)
Neutrophils Relative %: 56.4 % (ref 43.0–77.0)
Platelets: 389 10*3/uL (ref 150.0–400.0)
RBC: 4.24 Mil/uL (ref 3.87–5.11)
RDW: 16.1 % — ABNORMAL HIGH (ref 11.5–15.5)
WBC: 7.3 10*3/uL (ref 4.0–10.5)

## 2022-05-14 LAB — IBC + FERRITIN
Ferritin: 5.3 ng/mL — ABNORMAL LOW (ref 10.0–291.0)
Iron: 29 ug/dL — ABNORMAL LOW (ref 42–145)
Saturation Ratios: 5.8 % — ABNORMAL LOW (ref 20.0–50.0)
TIBC: 498.4 ug/dL — ABNORMAL HIGH (ref 250.0–450.0)
Transferrin: 356 mg/dL (ref 212.0–360.0)

## 2022-05-19 ENCOUNTER — Other Ambulatory Visit: Payer: Self-pay | Admitting: Family

## 2022-05-19 DIAGNOSIS — D649 Anemia, unspecified: Secondary | ICD-10-CM

## 2022-05-19 MED ORDER — FERROUS SULFATE 325 (65 FE) MG PO TABS: 325.0000 mg | ORAL_TABLET | Freq: Every day | ORAL | 2 refills | Status: DC

## 2022-05-19 NOTE — Assessment & Plan Note (Signed)
Reiterated the importance of screening colonoscopy, particularly in the setting of anemia.  She will reach back out to gastroenterology to schedule

## 2022-05-19 NOTE — Assessment & Plan Note (Signed)
Start Metformin and titrate.  Close follow-up

## 2022-05-19 NOTE — Progress Notes (Signed)
close

## 2022-05-19 NOTE — Assessment & Plan Note (Addendum)
No radicular symptoms on exam.  Presentation consistent with trochanteric bursitis.  I provided her with prednisone taper.  We will also transition to Cymbalta for additional pain control.  Consider MRI lumbar, sacral at follow-up

## 2022-05-19 NOTE — Assessment & Plan Note (Signed)
Acute on chronic.  Presentation consistent with impingement syndrome and/or rotator cuff pathology.  We will treat initially conservatively with prednisone taper.  If no improvement will arrange consult with orthopedics.

## 2022-05-19 NOTE — Assessment & Plan Note (Signed)
Suboptimal control.  We will wean off of Prozac and start Cymbalta for additional pain control.  Close follow-up

## 2022-06-08 ENCOUNTER — Inpatient Hospital Stay: Admission: RE | Admit: 2022-06-08 | Payer: BC Managed Care – PPO | Source: Ambulatory Visit

## 2022-06-08 ENCOUNTER — Other Ambulatory Visit: Payer: BC Managed Care – PPO

## 2022-06-19 ENCOUNTER — Ambulatory Visit
Admission: RE | Admit: 2022-06-19 | Discharge: 2022-06-19 | Disposition: A | Discharge status: Home / Self Care | Payer: BC Managed Care – PPO | Source: Ambulatory Visit | Attending: Family | Admitting: Family

## 2022-06-19 ENCOUNTER — Other Ambulatory Visit: Payer: BC Managed Care – PPO

## 2022-06-19 ENCOUNTER — Other Ambulatory Visit: Payer: Self-pay | Admitting: Family

## 2022-06-19 DIAGNOSIS — R928 Other abnormal and inconclusive findings on diagnostic imaging of breast: Secondary | ICD-10-CM

## 2022-06-19 DIAGNOSIS — Z1231 Encounter for screening mammogram for malignant neoplasm of breast: Secondary | ICD-10-CM | POA: Insufficient documentation

## 2022-06-19 DIAGNOSIS — N6489 Other specified disorders of breast: Secondary | ICD-10-CM | POA: Diagnosis not present

## 2022-06-30 ENCOUNTER — Ambulatory Visit: Payer: BC Managed Care – PPO | Admitting: Family

## 2022-07-14 ENCOUNTER — Encounter: Payer: Self-pay | Admitting: Family

## 2022-07-21 ENCOUNTER — Telehealth (INDEPENDENT_AMBULATORY_CARE_PROVIDER_SITE_OTHER): Payer: BC Managed Care – PPO | Admitting: Family

## 2022-07-21 ENCOUNTER — Encounter: Payer: Self-pay | Admitting: Family

## 2022-07-21 DIAGNOSIS — E669 Obesity, unspecified: Secondary | ICD-10-CM

## 2022-07-21 DIAGNOSIS — F32A Depression, unspecified: Secondary | ICD-10-CM

## 2022-07-21 DIAGNOSIS — M25562 Pain in left knee: Secondary | ICD-10-CM | POA: Diagnosis not present

## 2022-07-21 DIAGNOSIS — F419 Anxiety disorder, unspecified: Secondary | ICD-10-CM | POA: Diagnosis not present

## 2022-07-21 DIAGNOSIS — G8929 Other chronic pain: Secondary | ICD-10-CM

## 2022-07-21 MED ORDER — FLUOXETINE HCL 20 MG PO CAPS: 20.0000 mg | ORAL_CAPSULE | Freq: Every morning | ORAL | 3 refills | Status: DC

## 2022-07-21 MED ORDER — METFORMIN HCL ER 500 MG PO TB24: 2000.0000 mg | ORAL_TABLET | Freq: Every evening | ORAL | 3 refills | Status: DC

## 2022-07-21 NOTE — Progress Notes (Signed)
Virtual Visit via Video Note  I connected with@  on 07/21/22 at 11:00 AM EDT by a video enabled telemedicine application and verified that I am speaking with the correct person using two identifiers.  Location patient: home Location provider:work  Persons participating in the virtual visit: patient, provider  I discussed the limitations of evaluation and management by telemedicine and the availability of in person appointments. The patient expressed understanding and agreed to proceed.   HPI: Depression has worsened since coming off Prozac.  Today is a better day however she had periods last week in which she felt low self esteem.  She is sleeping well.  No anxiety.  she is compliant with cymbalta 30 mg.  She is no longer on prozac  Denies thoughts of hurting herself, HI.   Previously on wellbutrin, zoloft.   Left shoulder and left hip pain has resolved.   Left anterior knee continues to bother her , with some improvement. She has noticed a 'little bit' of swelling. No heat, redness.  No fall. Knee hasnt given way beneath her.   She has an appointment with Dr Servando Snare scheduled 10/22/22    ROS: See pertinent positives and negatives per HPI.    EXAM:  VITALS per patient if applicable: There were no vitals taken for this visit. BP Readings from Last 3 Encounters:  05/13/22 110/80  01/23/22 122/85  02/07/21 122/90   Wt Readings from Last 3 Encounters:  05/13/22 223 lb 9.6 oz (101.4 kg)  01/23/22 222 lb (100.7 kg)  04/22/21 227 lb (103 kg)    GENERAL: alert, oriented, appears well and in no acute distress  HEENT: atraumatic, conjunttiva clear, no obvious abnormalities on inspection of external nose and ears  NECK: normal movements of the head and neck  LUNGS: on inspection no signs of respiratory distress, breathing rate appears normal, no obvious gross SOB, gasping or wheezing  CV: no obvious cyanosis  MS: moves all visible extremities without noticeable  abnormality  PSYCH/NEURO: pleasant and cooperative, no obvious depression or anxiety, speech and thought processing grossly intact  ASSESSMENT AND PLAN:  Discussed the following assessment and plan:  Problem List Items Addressed This Visit       Other   Anxiety and depression - Primary    Recently uncontrolled after discontinuation of Prozac.  We have opted to discontinue Cymbalta and restart Prozac 20mg   which was helpful for her.  She will let me know how she is doing.      Relevant Medications   FLUoxetine (PROZAC) 20 MG capsule   Left knee pain    Chronic, uncontrolled.  Patient agreed to obtain baseline x-ray.  Will consider physical therapy, and orthopedic evaluation after we have results.      Relevant Orders   DG Knee Complete 4 Views Left   Obesity   Relevant Medications   metFORMIN (GLUCOPHAGE-XR) 500 MG 24 hr tablet    -we discussed possible serious and likely etiologies, options for evaluation and workup, limitations of telemedicine visit vs in person visit, treatment, treatment risks and precautions. Pt prefers to treat via telemedicine empirically rather then risking or undertaking an in person visit at this moment.  .   I discussed the assessment and treatment plan with the patient. The patient was provided an opportunity to ask questions and all were answered. The patient agreed with the plan and demonstrated an understanding of the instructions.   The patient was advised to call back or seek an in-person evaluation if the  symptoms worsen or if the condition fails to improve as anticipated.   Mable Paris, FNP

## 2022-07-21 NOTE — Assessment & Plan Note (Signed)
Recently uncontrolled after discontinuation of Prozac.  We have opted to discontinue Cymbalta and restart Prozac 20mg   which was helpful for her.  She will let me know how she is doing.

## 2022-07-21 NOTE — Patient Instructions (Addendum)
Stop cymbalta as of today Start prozac 20mg  tomorrow  Please call to schedule your knee xray when you can  Let me know how you are doing

## 2022-07-21 NOTE — Assessment & Plan Note (Signed)
Chronic, uncontrolled.  Patient agreed to obtain baseline x-ray.  Will consider physical therapy, and orthopedic evaluation after we have results.

## 2022-07-31 ENCOUNTER — Encounter: Payer: Self-pay | Admitting: Family

## 2022-07-31 ENCOUNTER — Other Ambulatory Visit: Payer: Self-pay

## 2022-07-31 DIAGNOSIS — F32A Depression, unspecified: Secondary | ICD-10-CM

## 2022-07-31 MED ORDER — BUPROPION HCL ER (XL) 300 MG PO TB24: 300.0000 mg | ORAL_TABLET | Freq: Every day | ORAL | 3 refills | Status: DC

## 2022-08-10 ENCOUNTER — Ambulatory Visit: Payer: BC Managed Care – PPO | Admitting: Family

## 2022-08-27 ENCOUNTER — Other Ambulatory Visit: Payer: Self-pay | Admitting: Family

## 2022-08-27 DIAGNOSIS — D649 Anemia, unspecified: Secondary | ICD-10-CM

## 2022-09-20 ENCOUNTER — Encounter: Payer: Self-pay | Admitting: Family

## 2022-09-20 ENCOUNTER — Other Ambulatory Visit: Payer: Self-pay | Admitting: Family

## 2022-09-20 DIAGNOSIS — E559 Vitamin D deficiency, unspecified: Secondary | ICD-10-CM

## 2022-09-21 MED ORDER — VITAMIN D3 1.25 MG (50000 UT) PO CAPS: 1.0000 | ORAL_CAPSULE | ORAL | 0 refills | Status: DC

## 2022-09-22 NOTE — Telephone Encounter (Signed)
LVM to call and schedule in office appt with Joycelyn Schmid

## 2022-09-24 NOTE — Telephone Encounter (Signed)
LVM to call back to schedule appt to discuss issues

## 2022-10-14 ENCOUNTER — Telehealth: Payer: Self-pay | Admitting: *Deleted

## 2022-10-14 NOTE — Telephone Encounter (Addendum)
This patient was transferred to my phone and after checking her chart. It shows that she canceled her new patient appointment with Dr Allen Norris 10/22/2022 via MyChart and she was calling to reschedule.  Please call patient back.

## 2022-10-18 ENCOUNTER — Other Ambulatory Visit: Payer: Self-pay | Admitting: Family

## 2022-10-18 DIAGNOSIS — E669 Obesity, unspecified: Secondary | ICD-10-CM

## 2022-10-22 ENCOUNTER — Ambulatory Visit: Payer: BC Managed Care – PPO | Admitting: Gastroenterology

## 2022-11-02 ENCOUNTER — Other Ambulatory Visit: Payer: Self-pay | Admitting: Family

## 2022-11-02 DIAGNOSIS — M25552 Pain in left hip: Secondary | ICD-10-CM

## 2022-12-19 ENCOUNTER — Other Ambulatory Visit: Payer: Self-pay | Admitting: Family

## 2022-12-19 DIAGNOSIS — E559 Vitamin D deficiency, unspecified: Secondary | ICD-10-CM

## 2022-12-24 MED ORDER — VITAMIN D3 1.25 MG (50000 UT) PO CAPS: 1.0000 | ORAL_CAPSULE | ORAL | 0 refills | Status: DC

## 2023-01-01 ENCOUNTER — Other Ambulatory Visit: Payer: Self-pay | Admitting: Family

## 2023-01-01 DIAGNOSIS — D649 Anemia, unspecified: Secondary | ICD-10-CM

## 2023-01-15 ENCOUNTER — Other Ambulatory Visit: Payer: Self-pay | Admitting: Family

## 2023-01-15 DIAGNOSIS — M25552 Pain in left hip: Secondary | ICD-10-CM

## 2023-03-18 ENCOUNTER — Other Ambulatory Visit: Payer: Self-pay | Admitting: Family

## 2023-03-18 DIAGNOSIS — E559 Vitamin D deficiency, unspecified: Secondary | ICD-10-CM

## 2023-03-18 NOTE — Telephone Encounter (Signed)
I called the patient and informed her that she needed labs before getting the Vitamin D medication and she stated she would call back and schedule the lab.  Nia,cma

## 2023-06-16 ENCOUNTER — Ambulatory Visit (INDEPENDENT_AMBULATORY_CARE_PROVIDER_SITE_OTHER): Payer: BC Managed Care – PPO | Admitting: Family

## 2023-06-16 ENCOUNTER — Encounter: Payer: Self-pay | Admitting: Family

## 2023-06-16 VITALS — BP 136/78 | HR 90 | Temp 98.6°F | Ht 63.0 in | Wt 232.8 lb

## 2023-06-16 DIAGNOSIS — G8929 Other chronic pain: Secondary | ICD-10-CM

## 2023-06-16 DIAGNOSIS — K1370 Unspecified lesions of oral mucosa: Secondary | ICD-10-CM

## 2023-06-16 DIAGNOSIS — M25562 Pain in left knee: Secondary | ICD-10-CM | POA: Diagnosis not present

## 2023-06-16 DIAGNOSIS — D649 Anemia, unspecified: Secondary | ICD-10-CM

## 2023-06-16 MED ORDER — MAGIC MOUTHWASH W/LIDOCAINE
5.0000 mL | Freq: Three times a day (TID) | ORAL | 0 refills | Status: AC | PRN
Start: 2023-06-16 — End: 2023-06-26

## 2023-06-16 MED ORDER — TRAMADOL HCL 50 MG PO TABS
50.0000 mg | ORAL_TABLET | Freq: Two times a day (BID) | ORAL | 0 refills | Status: AC | PRN
Start: 2023-06-16 — End: 2023-07-01

## 2023-06-16 NOTE — Assessment & Plan Note (Signed)
etiology unclear at this time.  Question if aphthous ulcer. I did swab to screen for herpes simplex. Trial of magic mouthwash.  If symptoms persist, will consult dermatology.

## 2023-06-16 NOTE — Patient Instructions (Signed)
Trial of Magic mouthwash for oral lesions.  Please let me know if lesions persist as I would like to arrange dermatology consult.  I provided you with tramadol to use sparingly for knee pain.  Please store this medication in a safe place as it is a controlled substance   I have placed referral to orthopedics  Let us know if you dont hear back within a week in regards to an appointment being scheduled.   So that you are aware, if you are [facility] MyChart user , please pay attention to your MyChart messages as you may receive a MyChart message with a phone number to call and schedule this test/appointment own your own from our referral coordinator. This is a new process so I do not want you to miss this message.  If you are not a MyChart user, you will receive a phone call.

## 2023-06-16 NOTE — Progress Notes (Signed)
Assessment & Plan:  Chronic pain of left knee Assessment & Plan: Question if patellofemoral syndrome versus arthritic changes.  Provided patient with a small course of tramadol to take as needed.  Recent history of anemia and we discussed avoiding meloxicam.  Encouraged icing regimen.  Referral to orthopedics as patient may benefit from injection.  Orders: -     traMADol HCl; Take 1 tablet (50 mg total) by mouth every 12 (twelve) hours as needed for up to 15 days.  Dispense: 30 tablet; Refill: 0 -     Ambulatory referral to Orthopedic Surgery  Anemia, unspecified type -     Comprehensive metabolic panel -     Iron, TIBC and Ferritin Panel  Oral lesion Assessment & Plan: etiology unclear at this time.  Question if aphthous ulcer. I did swab to screen for herpes simplex. Trial of magic mouthwash.  If symptoms persist, will consult dermatology.   Orders: -     magic mouthwash w/lidocaine; Take 5 mLs by mouth 3 (three) times daily as needed for up to 10 days for mouth pain.  Dispense: 150 mL; Refill: 0 -     Herpes simplex virus culture     Return precautions given.   Risks, benefits, and alternatives of the medications and treatment plan prescribed today were discussed, and patient expressed understanding.   Education regarding symptom management and diagnosis given to patient on AVS either electronically or printed.  No follow-ups on file.  Rennie Plowman, FNP  Subjective:    Patient ID: Taylor Wagner, female    DOB: 1976/08/29, 47 y.o.   MRN: [id number]  CC: Taylor Wagner is a 47 y.o. female who presents today for follow up.   HPI: She complains of mouth sores x 2 weeks ago.   One week ago, 'entire mouth was sore'. Soreness of entire mouth has improved on its own. Around the rim of top and bottom lips inside there are white bumps stil.   No fever, chills, sinus congestion  No new medications  Beef and pork allergy if ingested.   No dental concerns  or teeth pain.   Complains of left lateral knee pain x 1 year, which has had become more bothersome.   Pain with crouching, squatting.   No giving way.   She hears crepitus. Worse if sitting for a longer period.   No low back pain , numbness.   She tried ice/ heat, tylenol, mobic, and wrap without relief.   No h/ o seizure      Former smoker No h/o cold sores.   No h/o skin cancer    Allergies: Beef-derived products, Erythromycin, and Pork-derived products Current Outpatient Medications on File Prior to Visit  Medication Sig Dispense Refill   B-D 3CC LUER-LOK SYR 25GX1" 25G X 1" 3 ML MISC USED TO GIVE MONTHLY B-12 INJECTIONS.     B-D 3CC LUER-LOK SYR 25GX5/8" 25G X 5/8" 3 ML MISC USED TO GIVE MONTHLY B-12 INJECTIONS.     buPROPion (WELLBUTRIN XL) 300 MG 24 hr tablet Take 1 tablet (300 mg total) by mouth daily. 90 tablet 3   cyanocobalamin (,VITAMIN B-12,) 1000 MCG/ML injection INJECT 1000 MCG INTO THE MUSCLE ONCE A WEEK FOR 4 WEEKS THEN ONCE MONTHLY THEREAFTER. 6 mL 4   EPINEPHrine 0.3 mg/0.3 mL IJ SOAJ injection Inject 0.3 mLs (0.3 mg total) into the muscle as needed for anaphylaxis. 1 each 2   FLUoxetine (PROZAC) 20 MG capsule Take 1 capsule (20 mg total)  by mouth every morning. 90 capsule 3   metFORMIN (GLUCOPHAGE-XR) 500 MG 24 hr tablet TAKE 4 TABLETS (2,000 MG TOTAL) BY MOUTH EVERY EVENING. 360 tablet 1   NEEDLE, DISP, 25 G (B-D DISP NEEDLE 25GX1") 25G X 1" MISC Used to give monthly B-12 injections. 50 each 2   SYRINGE/NEEDLE, DISP, 1 ML (BD ECLIPSE SYRINGE) 25G X 5/8" 1 ML MISC Used to give monthly B-12 injections. 50 each 2   No current facility-administered medications on file prior to visit.    Review of Systems  Constitutional:  Negative for chills and fever.  HENT:  Positive for mouth sores. Negative for congestion, dental problem, sore throat and trouble swallowing.   Respiratory:  Negative for cough.   Cardiovascular:  Negative for chest pain and  palpitations.  Gastrointestinal:  Negative for nausea and vomiting.  Musculoskeletal:  Positive for arthralgias.      Objective:    BP 136/78   Pulse 90   Temp 98.6 F (37 C) (Oral)   Ht 5\' 3"  (1.6 m)   Wt 232 lb 12.8 oz (105.6 kg)   SpO2 98%   BMI 41.24 kg/m  BP Readings from Last 3 Encounters:  06/16/23 136/78  05/13/22 110/80  01/23/22 122/85   Wt Readings from Last 3 Encounters:  06/16/23 232 lb 12.8 oz (105.6 kg)  05/13/22 223 lb 9.6 oz (101.4 kg)  01/23/22 222 lb (100.7 kg)    Physical Exam Vitals reviewed.  Constitutional:      Appearance: She is well-developed.  HENT:     Head: Normocephalic and atraumatic.     Comments: Very fine papular lesions noted inside of the upper and lower lip. Non confluent. No white exudate.     Right Ear: Hearing, tympanic membrane, ear canal and external ear normal. No decreased hearing noted. No drainage, swelling or tenderness. No middle ear effusion. No foreign body. Tympanic membrane is not erythematous or bulging.     Left Ear: Hearing, tympanic membrane, ear canal and external ear normal. No decreased hearing noted. No drainage, swelling or tenderness.  No middle ear effusion. No foreign body. Tympanic membrane is not erythematous or bulging.     Nose: Nose normal. No rhinorrhea.     Right Sinus: No maxillary sinus tenderness or frontal sinus tenderness.     Left Sinus: No maxillary sinus tenderness or frontal sinus tenderness.     Mouth/Throat:     Pharynx: Uvula midline. No oropharyngeal exudate or posterior oropharyngeal erythema.     Tonsils: No tonsillar abscesses.  Eyes:     Conjunctiva/sclera: Conjunctivae normal.  Cardiovascular:     Rate and Rhythm: Regular rhythm.     Pulses: Normal pulses.     Heart sounds: Normal heart sounds.  Pulmonary:     Effort: Pulmonary effort is normal.     Breath sounds: Normal breath sounds. No wheezing, rhonchi or rales.  Musculoskeletal:     Comments: Bilateral knees are symmetric.  No effusion appreciated. No increase in warmth or erythema. Crepitus felt with flexion of bilateral knees.  Left knee:  Able to extend to -5 to 10 degrees and flex to 110 degrees. No catching with McMurray maneuver. No patellar apprehension. Negative anterior drawer and lachman's- no laxity appreciated.  Lymphadenopathy:     Head:     Right side of head: No submental, submandibular, tonsillar, preauricular, posterior auricular or occipital adenopathy.     Left side of head: No submental, submandibular, tonsillar, preauricular, posterior auricular or occipital adenopathy.  Cervical: No cervical adenopathy.  Skin:    General: Skin is warm and dry.  Neurological:     Mental Status: She is alert.  Psychiatric:        Speech: Speech normal.        Behavior: Behavior normal.        Thought Content: Thought content normal.

## 2023-06-16 NOTE — Assessment & Plan Note (Signed)
Question if patellofemoral syndrome versus arthritic changes.  Provided patient with a small course of tramadol to take as needed.  Recent history of anemia and we discussed avoiding meloxicam.  Encouraged icing regimen.  Referral to orthopedics as patient may benefit from injection.

## 2023-06-17 LAB — COMPREHENSIVE METABOLIC PANEL
ALT: 13 U/L (ref 0–35)
AST: 11 U/L (ref 0–37)
Albumin: 4.1 g/dL (ref 3.5–5.2)
Alkaline Phosphatase: 58 U/L (ref 39–117)
BUN: 15 mg/dL (ref 6–23)
CO2: 26 mEq/L (ref 19–32)
Calcium: 9.5 mg/dL (ref 8.4–10.5)
Chloride: 102 mEq/L (ref 96–112)
Creatinine, Ser: 0.66 mg/dL (ref 0.40–1.20)
GFR: 105.64 mL/min (ref >60.00)
Glucose, Bld: 86 mg/dL (ref 70–99)
Potassium: 4.5 mEq/L (ref 3.5–5.1)
Sodium: 136 mEq/L (ref 135–145)
Total Bilirubin: 0.3 mg/dL (ref 0.2–1.2)
Total Protein: 6.8 g/dL (ref 6.0–8.3)

## 2023-06-17 LAB — IRON,TIBC AND FERRITIN PANEL
%SAT: 18 % (calc) (ref 16–45)
Ferritin: 8 ng/mL — ABNORMAL LOW (ref 16–232)
Iron: 73 ug/dL (ref 40–190)
TIBC: 401 mcg/dL (calc) (ref 250–450)

## 2023-06-18 ENCOUNTER — Telehealth: Payer: Self-pay | Admitting: Family

## 2023-06-18 ENCOUNTER — Other Ambulatory Visit: Payer: Self-pay | Admitting: Family

## 2023-06-18 DIAGNOSIS — K1370 Unspecified lesions of oral mucosa: Secondary | ICD-10-CM

## 2023-06-18 DIAGNOSIS — R790 Abnormal level of blood mineral: Secondary | ICD-10-CM

## 2023-06-18 NOTE — Telephone Encounter (Signed)
Taylor Wagner team  Call pt   Please apologize however herpes swab was not stored in the refrigerator after collected and we are unable to processed.  I am very very sorry for this.  I did not know this happened to the day after As opposed to asking her to come back and I am attempting to add on a serum blood test for HSV.  If we cannot add on she may come back in the office.   Have her oral sores responded to Magic mouthwash?    Toya,   Can we possibly add on serum HSV labs as swab not stored in refrigerator? I ordered

## 2023-06-18 NOTE — Addendum Note (Signed)
Addended by: Warden Fillers on: 06/18/2023 08:07 AM   Modules accepted: Orders

## 2023-06-18 NOTE — Telephone Encounter (Signed)
Called and spoke with patient, she was understanding and can come back to the office if needed. She states the oral sores have resolved on their own, she hadn't even picked up the magic mouthwash yet. She stated given her history with these and how they come back she would still like to proceed with testing and is planning to pick up the mouth wash to use again if needed.

## 2023-06-21 NOTE — Telephone Encounter (Signed)
Call pt  Sch labs in the 1-2 weeks I ordered HSV abs and CBC

## 2023-06-22 NOTE — Telephone Encounter (Signed)
Pt scheduled for labs  07/05/23

## 2023-07-05 ENCOUNTER — Other Ambulatory Visit (INDEPENDENT_AMBULATORY_CARE_PROVIDER_SITE_OTHER): Payer: BC Managed Care – PPO

## 2023-07-05 DIAGNOSIS — K1370 Unspecified lesions of oral mucosa: Secondary | ICD-10-CM

## 2023-07-05 DIAGNOSIS — R790 Abnormal level of blood mineral: Secondary | ICD-10-CM | POA: Diagnosis not present

## 2023-07-06 LAB — CBC WITH DIFFERENTIAL/PLATELET
Basophils Absolute: 0.1 10*3/uL (ref 0.0–0.1)
Basophils Relative: 0.8 % (ref 0.0–3.0)
Eosinophils Absolute: 0.3 10*3/uL (ref 0.0–0.7)
Eosinophils Relative: 4.2 % (ref 0.0–5.0)
HCT: 35.7 % — ABNORMAL LOW (ref 36.0–46.0)
Hemoglobin: 11.8 g/dL — ABNORMAL LOW (ref 12.0–15.0)
Lymphocytes Relative: 32 % (ref 12.0–46.0)
Lymphs Abs: 2.3 10*3/uL (ref 0.7–4.0)
MCHC: 33.1 g/dL (ref 30.0–36.0)
MCV: 89.5 fl (ref 78.0–100.0)
Monocytes Absolute: 0.5 10*3/uL (ref 0.1–1.0)
Monocytes Relative: 7.3 % (ref 3.0–12.0)
Neutro Abs: 4 10*3/uL (ref 1.4–7.7)
Neutrophils Relative %: 55.7 % (ref 43.0–77.0)
Platelets: 390 10*3/uL (ref 150.0–400.0)
RBC: 3.99 Mil/uL (ref 3.87–5.11)
RDW: 14.9 % (ref 11.5–15.5)
WBC: 7.3 10*3/uL (ref 4.0–10.5)

## 2023-07-06 LAB — HSV(HERPES SIMPLEX VRS) I + II AB-IGG
HAV 1 IGG,TYPE SPECIFIC AB: 37.7 index — ABNORMAL HIGH
HSV 2 IGG,TYPE SPECIFIC AB: <0.9 index

## 2023-07-14 ENCOUNTER — Other Ambulatory Visit: Payer: Self-pay | Admitting: Family

## 2023-07-14 ENCOUNTER — Encounter: Payer: Self-pay | Admitting: Family

## 2023-07-14 DIAGNOSIS — D649 Anemia, unspecified: Secondary | ICD-10-CM

## 2023-07-16 ENCOUNTER — Other Ambulatory Visit: Payer: Self-pay | Admitting: Family

## 2023-07-16 DIAGNOSIS — E669 Obesity, unspecified: Secondary | ICD-10-CM

## 2023-07-18 ENCOUNTER — Other Ambulatory Visit: Payer: Self-pay | Admitting: Family

## 2023-07-18 DIAGNOSIS — D649 Anemia, unspecified: Secondary | ICD-10-CM

## 2023-07-19 ENCOUNTER — Other Ambulatory Visit: Payer: Self-pay | Admitting: Family

## 2023-07-19 DIAGNOSIS — K1370 Unspecified lesions of oral mucosa: Secondary | ICD-10-CM

## 2023-07-19 MED ORDER — MAGIC MOUTHWASH W/LIDOCAINE
5.0000 mL | Freq: Three times a day (TID) | ORAL | 0 refills | Status: AC | PRN
Start: 2023-07-19 — End: 2023-07-29

## 2023-08-04 ENCOUNTER — Other Ambulatory Visit: Payer: Self-pay | Admitting: Family

## 2023-08-04 DIAGNOSIS — F419 Anxiety disorder, unspecified: Secondary | ICD-10-CM

## 2023-08-04 DIAGNOSIS — F32A Depression, unspecified: Secondary | ICD-10-CM

## 2023-08-07 ENCOUNTER — Other Ambulatory Visit: Payer: Self-pay | Admitting: Family

## 2023-08-07 DIAGNOSIS — M25552 Pain in left hip: Secondary | ICD-10-CM

## 2023-08-13 ENCOUNTER — Encounter: Payer: Self-pay | Admitting: Family

## 2023-09-15 DIAGNOSIS — M1712 Unilateral primary osteoarthritis, left knee: Secondary | ICD-10-CM | POA: Diagnosis not present

## 2023-09-16 ENCOUNTER — Other Ambulatory Visit: Payer: Self-pay | Admitting: Family

## 2023-09-16 ENCOUNTER — Encounter: Payer: Self-pay | Admitting: Gastroenterology

## 2023-09-16 ENCOUNTER — Encounter: Payer: Self-pay | Admitting: Family

## 2023-09-16 ENCOUNTER — Ambulatory Visit: Payer: BC Managed Care – PPO | Admitting: Gastroenterology

## 2023-09-16 ENCOUNTER — Ambulatory Visit (INDEPENDENT_AMBULATORY_CARE_PROVIDER_SITE_OTHER): Payer: BC Managed Care – PPO | Admitting: Family

## 2023-09-16 VITALS — BP 136/88 | HR 103 | Temp 98.0°F | Ht 63.0 in | Wt 237.0 lb

## 2023-09-16 VITALS — BP 128/80 | HR 90 | Temp 98.0°F | Resp 16 | Ht 63.0 in | Wt 235.0 lb

## 2023-09-16 DIAGNOSIS — R7303 Prediabetes: Secondary | ICD-10-CM | POA: Diagnosis not present

## 2023-09-16 DIAGNOSIS — Z13220 Encounter for screening for lipoid disorders: Secondary | ICD-10-CM | POA: Diagnosis not present

## 2023-09-16 DIAGNOSIS — E663 Overweight: Secondary | ICD-10-CM

## 2023-09-16 DIAGNOSIS — E669 Obesity, unspecified: Secondary | ICD-10-CM

## 2023-09-16 DIAGNOSIS — E559 Vitamin D deficiency, unspecified: Secondary | ICD-10-CM | POA: Diagnosis not present

## 2023-09-16 DIAGNOSIS — D649 Anemia, unspecified: Secondary | ICD-10-CM | POA: Diagnosis not present

## 2023-09-16 DIAGNOSIS — D500 Iron deficiency anemia secondary to blood loss (chronic): Secondary | ICD-10-CM | POA: Diagnosis not present

## 2023-09-16 DIAGNOSIS — Z1231 Encounter for screening mammogram for malignant neoplasm of breast: Secondary | ICD-10-CM

## 2023-09-16 LAB — CBC WITH DIFFERENTIAL/PLATELET
Basophils Absolute: 0 10*3/uL (ref 0.0–0.1)
Basophils Relative: 0.5 % (ref 0.0–3.0)
Eosinophils Absolute: 0.3 10*3/uL (ref 0.0–0.7)
Eosinophils Relative: 4.8 % (ref 0.0–5.0)
HCT: 38.5 % (ref 36.0–46.0)
Hemoglobin: 12.3 g/dL (ref 12.0–15.0)
Lymphocytes Relative: 26 % (ref 12.0–46.0)
Lymphs Abs: 1.7 10*3/uL (ref 0.7–4.0)
MCHC: 31.9 g/dL (ref 30.0–36.0)
MCV: 91.6 fl (ref 78.0–100.0)
Monocytes Absolute: 0.6 10*3/uL (ref 0.1–1.0)
Monocytes Relative: 8.5 % (ref 3.0–12.0)
Neutro Abs: 3.9 10*3/uL (ref 1.4–7.7)
Neutrophils Relative %: 60.2 % (ref 43.0–77.0)
Platelets: 333 10*3/uL (ref 150.0–400.0)
RBC: 4.21 Mil/uL (ref 3.87–5.11)
RDW: 14.7 % (ref 11.5–15.5)
WBC: 6.5 10*3/uL (ref 4.0–10.5)

## 2023-09-16 LAB — IBC + FERRITIN
Ferritin: 15.3 ng/mL (ref 10.0–291.0)
Iron: 77 ug/dL (ref 42–145)
Saturation Ratios: 17.1 % — ABNORMAL LOW (ref 20.0–50.0)
TIBC: 449.4 ug/dL (ref 250.0–450.0)
Transferrin: 321 mg/dL (ref 212.0–360.0)

## 2023-09-16 LAB — COMPREHENSIVE METABOLIC PANEL
ALT: 14 U/L (ref 0–35)
AST: 12 U/L (ref 0–37)
Albumin: 4.1 g/dL (ref 3.5–5.2)
Alkaline Phosphatase: 58 U/L (ref 39–117)
BUN: 14 mg/dL (ref 6–23)
CO2: 24 mEq/L (ref 19–32)
Calcium: 9.2 mg/dL (ref 8.4–10.5)
Chloride: 105 mEq/L (ref 96–112)
Creatinine, Ser: 0.62 mg/dL (ref 0.40–1.20)
GFR: 107.06 mL/min (ref >60.00)
Glucose, Bld: 95 mg/dL (ref 70–99)
Potassium: 4.4 mEq/L (ref 3.5–5.1)
Sodium: 136 mEq/L (ref 135–145)
Total Bilirubin: 0.3 mg/dL (ref 0.2–1.2)
Total Protein: 7.1 g/dL (ref 6.0–8.3)

## 2023-09-16 LAB — URINALYSIS, ROUTINE W REFLEX MICROSCOPIC
Bilirubin Urine: NEGATIVE
Hgb urine dipstick: NEGATIVE
Ketones, ur: NEGATIVE
Leukocytes,Ua: NEGATIVE
Nitrite: NEGATIVE
Specific Gravity, Urine: 1.025 (ref 1.000–1.030)
Total Protein, Urine: NEGATIVE
Urine Glucose: NEGATIVE
Urobilinogen, UA: 0.2 (ref 0.0–1.0)
pH: 6.5 (ref 5.0–8.0)

## 2023-09-16 LAB — LIPID PANEL
Cholesterol: 167 mg/dL (ref 0–200)
HDL: 80.5 mg/dL (ref >39.00)
LDL Cholesterol: 74 mg/dL (ref 0–99)
NonHDL: 86.17
Total CHOL/HDL Ratio: 2
Triglycerides: 59 mg/dL (ref 0.0–149.0)
VLDL: 11.8 mg/dL (ref 0.0–40.0)

## 2023-09-16 LAB — VITAMIN D 25 HYDROXY (VIT D DEFICIENCY, FRACTURES): VITD: 24.86 ng/mL — ABNORMAL LOW (ref 30.00–100.00)

## 2023-09-16 LAB — B12 AND FOLATE PANEL
Folate: >24.2 ng/mL (ref >5.9)
Vitamin B-12: 141 pg/mL — ABNORMAL LOW (ref 211–911)

## 2023-09-16 LAB — HEMOGLOBIN A1C: Hgb A1c MFr Bld: 5.5 % (ref 4.6–6.5)

## 2023-09-16 LAB — TSH: TSH: 1.82 u[IU]/mL (ref 0.35–5.50)

## 2023-09-16 MED ORDER — METFORMIN HCL ER 500 MG PO TB24: 2000.0000 mg | ORAL_TABLET | Freq: Every evening | ORAL | 1 refills | Status: DC

## 2023-09-16 MED ORDER — TIRZEPATIDE-WEIGHT MANAGEMENT 2.5 MG/0.5ML ~~LOC~~ SOLN
2.5000 mg | SUBCUTANEOUS | 1 refills | Status: AC
Start: 2023-09-16 — End: ?

## 2023-09-16 MED ORDER — NA SULFATE-K SULFATE-MG SULF 17.5-3.13-1.6 GM/177ML PO SOLN: 1.0000 | Freq: Once | ORAL | 0 refills | Status: AC

## 2023-09-16 NOTE — Progress Notes (Signed)
Gastroenterology Consultation  Referring Provider:     Allegra Grana, FNP Primary Care Physician:  Allegra Grana, FNP Primary Gastroenterologist:  Dr. Servando Snare     Reason for Consultation:     Anemia        HPI:   Taylor Wagner is a 47 y.o. y/o female referred for consultation & management of anemia by Dr. Jason Coop, Lyn Records, FNP.  This patient comes in to see me with a history of anemia.  The patient's blood work has shown:  Component     Latest Ref Rng 01/23/2022 05/13/2022 07/05/2023  Hemoglobin     12.0 - 15.0 g/dL 62.9 (L)  52.8 (L)  41.3 (L)   HCT     36.0 - 46.0 % 31.8 (L)  34.4 (L)  35.7 (L)   MCV     78.0 - 100.0 fl 86.4  81.1  89.5    In addition to this blood work the patient also had iron studies that showed:  Component     Latest Ref Rng 05/13/2022 06/16/2023  Iron     40 - 190 mcg/dL 29 (L)  73   Ferritin     16 - 232 ng/mL 5.3 (L)  8 (L)   TIBC     250 - 450 mcg/dL (calc) 244.0 (H)  102   %SAT     16 - 45 % (calc)  18    This blood work was done in June and July of this year with her having more blood work done today that has not resulted yet. The patient reports that she is not sure but she thinks she may have heavy menstrual's cycles but reports that she is perimenopausal.  There is no report of any unexplained weight loss fevers chills nausea vomiting black stools or bloody stools.  Past Medical History:  Diagnosis Date   ADD (attention deficit disorder with hyperactivity)    Allergy    Depression     Past Surgical History:  Procedure Laterality Date   BREAST BIOPSY Left 03/27/2021   stereo bx ribbon clip BENIGN BREAST TISSUE, MOSTLY FIBROUS,   CESAREAN SECTION     x3   FOOT SURGERY     pin removal   TUBAL LIGATION      Prior to Admission medications   Medication Sig Start Date End Date Taking? Authorizing Provider  B-D 3CC LUER-LOK SYR 25GX1" 25G X 1" 3 ML MISC USED TO GIVE MONTHLY B-12 INJECTIONS. 12/24/20   [provider]  B-D 3CC LUER-LOK SYR 25GX5/8" 25G X 5/8" 3 ML MISC USED TO GIVE MONTHLY B-12 INJECTIONS. 12/25/20   [provider]  buPROPion (WELLBUTRIN XL) 300 MG 24 hr tablet TAKE 1 TABLET BY MOUTH EVERY DAY 08/04/23   Allegra Grana, FNP  cyanocobalamin (,VITAMIN B-12,) 1000 MCG/ML injection INJECT 1000 MCG INTO THE MUSCLE ONCE A WEEK FOR 4 WEEKS THEN ONCE MONTHLY THEREAFTER. 12/30/21   Allegra Grana, FNP  EPINEPHrine 0.3 mg/0.3 mL IJ SOAJ injection Inject 0.3 mLs (0.3 mg total) into the muscle as needed for anaphylaxis. 08/09/20   Allegra Grana, FNP  metFORMIN (GLUCOPHAGE-XR) 500 MG 24 hr tablet Take 4 tablets (2,000 mg total) by mouth every evening. 09/16/23   Allegra Grana, FNP  NEEDLE, DISP, 25 G (B-D DISP NEEDLE 25GX1") 25G X 1" MISC Used to give monthly B-12 injections. 12/24/20   Allegra Grana, FNP  SYRINGE/NEEDLE, DISP, 1 ML (BD ECLIPSE SYRINGE) 25G X  5/8" 1 ML MISC Used to give monthly B-12 injections. 12/24/20   Allegra Grana, FNP  tirzepatide (ZEPBOUND) 2.5 MG/0.5ML injection vial Inject 2.5 mg into the skin once a week. 09/16/23   Allegra Grana, FNP    Family History  Problem Relation Age of Onset   Diabetes Mother    Stroke Father 62   Stroke Maternal Grandfather        several strokes   Stroke Paternal Grandfather    Stroke Maternal Aunt    Diabetes Maternal Aunt    Breast cancer Neg Hx    Colon cancer Neg Hx    Thyroid cancer Neg Hx      Social History   Tobacco Use   Smoking status: Former   Smokeless tobacco: Never   Tobacco comments:    quit 2020  Substance Use Topics   Alcohol use: Yes    Comment: occasional   Drug use: No    Allergies as of 09/16/2023 - Review Complete 09/16/2023  Allergen Reaction Noted   Beef-derived products  02/26/2015   Erythromycin Hives 08/20/2011   Pork-derived products  02/26/2015    Review of Systems:    All systems reviewed and negative except where noted in HPI.   Physical Exam:  There were no  vitals taken for this visit. No LMP recorded. General:   Alert,  Well-developed, well-nourished, pleasant and cooperative in NAD Head:  Normocephalic and atraumatic. Eyes:  Sclera clear, no icterus.   Conjunctiva pink. Ears:  Normal auditory acuity. Neck:  Supple; no masses or thyromegaly. Lungs:  Respirations even and unlabored.  Clear throughout to auscultation.   No wheezes, crackles, or rhonchi. No acute distress. Heart:  Regular rate and rhythm; no murmurs, clicks, rubs, or gallops. Abdomen:  Normal bowel sounds.  No bruits.  Soft, non-tender and non-distended without masses, hepatosplenomegaly or hernias noted.  No guarding or rebound tenderness.  Negative Carnett sign.   Rectal:  Deferred.  Pulses:  Normal pulses noted. Extremities:  No clubbing or edema.  No cyanosis. Neurologic:  Alert and oriented x3;  grossly normal neurologically. Skin:  Intact without significant lesions or rashes.  No jaundice. Lymph Nodes:  No significant cervical adenopathy. Psych:  Alert and cooperative. Normal mood and affect.  Imaging Studies: No results found.  Assessment and Plan:   Taylor Wagner is a 47 y.o. y/o female who comes in with iron deficient anemia.  The patient will be set up for an EGD and colonoscopy to look for source of the patient's anemia.  The patient has also been told that if these are negative that she may need a capsule endoscopy as further workup for a small bowel source of bleeding.  The patient has been explained the plan and agrees with it.  She will follow-up at the time of the EGD and colonoscopy.    Midge Minium, MD. Clementeen Graham    Note: This dictation was prepared with Dragon dictation along with smaller phrase technology. Any transcriptional errors that result from this process are unintentional.

## 2023-09-16 NOTE — Progress Notes (Signed)
Assessment & Plan:  Anemia, unspecified type -     IBC + Ferritin -     Urinalysis, Routine w reflex microscopic -     B12 and Folate Panel -     CBC with Differential/Platelet -     Comprehensive metabolic panel -     TSH  Vitamin D deficiency -     VITAMIN D 25 Hydroxy (Vit-D Deficiency, Fractures)  Prediabetes -     Hemoglobin A1c  Screening cholesterol level -     Lipid panel  Class 1 obesity without serious comorbidity in adult, unspecified BMI, unspecified obesity type Assessment & Plan: Family h/o CVA. Discussed Wegovy if Zepbound is not approved due to CV risk, nonfatal stroke risk reduction.  For now, we will start a trial of Zepbound.  Counseled on side effects, titration and blackbox warning as a relates to medullary thyroid cancer, multiple endocrine neoplasia.  Patient prefers to continue metformin 2000 mg daily as well.   Orders: -     metFORMIN HCl ER; Take 4 tablets (2,000 mg total) by mouth every evening.  Dispense: 360 tablet; Refill: 1  Overweight  Encounter for screening mammogram for malignant neoplasm of breast -     MM 3D DIAGNOSTIC MAMMOGRAM BILATERAL BREAST; Future -     [place] LIMITED ULTRASOUND INCLUDING AXILLA RIGHT BREAST; Future     Return precautions given.   Risks, benefits, and alternatives of the medications and treatment plan prescribed today were discussed, and patient expressed understanding.   Education regarding symptom management and diagnosis given to patient on AVS either electronically or printed.  No follow-ups on file.  Rennie Plowman, FNP  Subjective:    Patient ID: Taylor Wagner, female    DOB: April 04, 1976, 47 y.o.   MRN: [id number]  CC: Taylor Wagner is a 47 y.o. female who presents today for follow up.   HPI: Patient feels well today.  No new complaints.  She is interested in starting Zepbound for weight loss.  Previously she had been on Ozempic but insurance stopped covering.  She tolerated well.  No  personal or family history of medullary thyroid cancer, multiple endocrine neoplasia   Compliant with metformin 2000 mg daily; she has found this medication to be helpful.  She would like to continue metformin H/o prediabetes Father and maternal grandfather h/o CVA  Consult today with Dr. Servando Snare Due for colonoscopy, mammogram  Allergies: Beef-derived products, Erythromycin, and Pork-derived products Current Outpatient Medications on File Prior to Visit  Medication Sig Dispense Refill   B-D 3CC LUER-LOK SYR 25GX1" 25G X 1" 3 ML MISC USED TO GIVE MONTHLY B-12 INJECTIONS.     B-D 3CC LUER-LOK SYR 25GX5/8" 25G X 5/8" 3 ML MISC USED TO GIVE MONTHLY B-12 INJECTIONS.     buPROPion (WELLBUTRIN XL) 300 MG 24 hr tablet TAKE 1 TABLET BY MOUTH EVERY DAY 90 tablet 3   cyanocobalamin (,VITAMIN B-12,) 1000 MCG/ML injection INJECT 1000 MCG INTO THE MUSCLE ONCE A WEEK FOR 4 WEEKS THEN ONCE MONTHLY THEREAFTER. 6 mL 4   EPINEPHrine 0.3 mg/0.3 mL IJ SOAJ injection Inject 0.3 mLs (0.3 mg total) into the muscle as needed for anaphylaxis. 1 each 2   NEEDLE, DISP, 25 G (B-D DISP NEEDLE 25GX1") 25G X 1" MISC Used to give monthly B-12 injections. 50 each 2   SYRINGE/NEEDLE, DISP, 1 ML (BD ECLIPSE SYRINGE) 25G X 5/8" 1 ML MISC Used to give monthly B-12 injections. 50 each 2   No current  facility-administered medications on file prior to visit.    Review of Systems  Constitutional:  Negative for chills and fever.  Respiratory:  Negative for cough.   Cardiovascular:  Negative for chest pain and palpitations.  Gastrointestinal:  Negative for nausea and vomiting.      Objective:    BP 128/80   Pulse 90   Temp 98 F (36.7 C)   Resp 16   Ht 5\' 3"  (1.6 m)   Wt 235 lb (106.6 kg)   SpO2 99%   BMI 41.63 kg/m  BP Readings from Last 3 Encounters:  09/16/23 128/80  06/16/23 136/78  05/13/22 110/80   Wt Readings from Last 3 Encounters:  09/16/23 235 lb (106.6 kg)  06/16/23 232 lb 12.8 oz (105.6 kg)  05/13/22  223 lb 9.6 oz (101.4 kg)    Physical Exam Vitals reviewed.  Constitutional:      Appearance: She is well-developed.  Eyes:     Conjunctiva/sclera: Conjunctivae normal.  Cardiovascular:     Rate and Rhythm: Normal rate and regular rhythm.     Pulses: Normal pulses.     Heart sounds: Normal heart sounds.  Pulmonary:     Effort: Pulmonary effort is normal.     Breath sounds: Normal breath sounds. No wheezing, rhonchi or rales.  Skin:    General: Skin is warm and dry.  Neurological:     Mental Status: She is alert.  Psychiatric:        Speech: Speech normal.        Behavior: Behavior normal.        Thought Content: Thought content normal.

## 2023-09-16 NOTE — Assessment & Plan Note (Deleted)
Family h/o CVA. Discussed Wegovy if Zepbound is not approved due to CV risk, nonfatal stroke risk reduction.

## 2023-09-16 NOTE — Patient Instructions (Addendum)
Please call  and schedule your 3D mammogram and /or bone density scan as we discussed.   [facility]  ( new location in 2023)  [address], [place], [place] [address]  [place], [place]  [phone]   start Zepbound 2.5mg  once per week injected subcutaneously ( [place])  in stomach. Please clean with alcohol swab prior to injection and be sure to rotate site. You may schedule a nurse visit if you would like to first injection.   After 4 weeks, and if tolerated and weight loss has not reached 1-2 lbs per week, please increase to 5mg  once per week [place].  Once you are actively losing weight, you do not need to further increase medication.  Dose increments are below.   7.5 mg/0.5 mL (0.5 mL) 10 mg/0.5 mL (0.5 mL) 12.5 mg/0.5 mL (0.5 mL) 15 mg/0.5 mL (0.5 mL)   Please read information on medication below and remember black box warning that you may not take if you or a family member is diagnosed with thyroid cancer (medullary thyroid cancer), or multiple endocrine neoplasia.       Brand Names: [place] Mounjaro; Zepbound Brand Names: [place] Mounjaro  Warning  This drug has been shown to cause thyroid cancer in some animals. It is not known if this happens in humans. If thyroid cancer happens, it may be deadly if not found and treated early. Call your doctor right away if you have a neck mass, trouble breathing, trouble swallowing, or have hoarseness that will not go away.  Do not use this drug if you have a health problem called Multiple Endocrine Neoplasia syndrome type 2 (MEN 2), or if you or a family member have had thyroid cancer.  Have your blood work checked and thyroid ultrasounds as you have been told by your doctor. What is this drug used for?  It is used to lower blood sugar in people with type 2 diabetes.  It is used to help with weight loss in certain people. What do I need to tell my doctor BEFORE I take this drug? All products:  If you are allergic to this  drug; any part of this drug; or any other drugs, foods, or substances. Tell your doctor about the allergy and what signs you had.  If you have ever had pancreatitis.  If you have stomach or bowel problems.  If you are using another drug that has the same drug in it.  If you are using another drug like this one. If you are not sure, ask your doctor or pharmacist. If you are using this drug for diabetes:  If you have type 1 diabetes. Do not use this drug to treat type 1 diabetes. Zepbound:  If you have or have ever had depression or thoughts of suicide. This is not a list of all drugs or health problems that interact with this drug. Tell your doctor and pharmacist about all of your drugs (prescription or OTC, natural products, vitamins) and health problems. You must check to make sure that it is safe for you to take this drug with all of your drugs and health problems. Do not start, stop, or change the dose of any drug without checking with your doctor. What are some things I need to know or do while I take this drug? All products:  Tell all of your health care providers that you take this drug. This includes your doctors, nurses, pharmacists, and dentists.  Follow the diet and workout plan  that your doctor told you about.  Talk with your doctor before you drink alcohol.  Birth control pills may not work as well to prevent pregnancy. If you take birth control pills, you may need to switch to another type of hormone-based birth control like a vaginal ring if your doctor tells you to. If another type of hormone-based birth control is not an option, use some other kind of birth control also, like a condom. Do this for 4 weeks after starting this drug and for 4 weeks each time the dose is raised.  This drug may prevent other drugs taken by mouth from getting into the body. If you take other drugs by mouth, you may need to take them at some other time than this drug. Talk with your doctor.  Do not share  with another person even if the needle has been changed. Sharing your tray or pen may pass infections from one person to another. This includes infections you may not know you have.  If you cannot drink liquids by mouth or if you have upset stomach, throwing up, or diarrhea that does not go away; you need to avoid getting dehydrated. Contact your doctor to find out what to do. Dehydration may lead to low blood pressure or to new or worse kidney problems.  A severe and sometimes deadly pancreas problem (pancreatitis) has happened with other drugs like this one. If you are using this drug for diabetes:  It may be harder to control blood sugar during times of stress such as fever, infection, injury, or surgery. A change in physical activity, exercise, or diet may also affect blood sugar.  Check your blood sugar as you have been told by your doctor.  Do not drive if your blood sugar has been low. There is a greater chance of you having a crash.  Wear disease medical alert ID (identification).  Tell your doctor if you are pregnant, plan on getting pregnant, or are breast-feeding. You will need to talk about the benefits and risks to you and the baby. Zepbound:  If you have high blood sugar (diabetes), you will need to watch your blood sugar closely.  Weight loss during pregnancy may cause harm to the unborn baby. If you get pregnant while taking this drug or if you want to get pregnant, call your doctor right away.  Tell your doctor if you are breast-feeding. You will need to talk about any risks to your baby. What are some side effects that I need to call my doctor about right away? WARNING/CAUTION: Even though it may be rare, some people may have very bad and sometimes deadly side effects when taking a drug. Tell your doctor or get medical help right away if you have any of the following signs or symptoms that may be related to a very bad side effect: All products:  Signs of an allergic reaction, like  rash; hives; itching; red, swollen, blistered, or peeling skin with or without fever; wheezing; tightness in the chest or throat; trouble breathing, swallowing, or talking; unusual hoarseness; or swelling of the mouth, face, lips, tongue, or throat.  Signs of kidney problems like unable to pass urine, change in how much urine is passed, blood in the urine, or a big weight gain.  Signs of gallbladder problems like pain in the upper right belly area, right shoulder area, or between the shoulder blades; yellow skin or eyes; fever with chills; bloating; or very upset stomach or throwing up.  Signs of  a pancreas problem (pancreatitis) like very bad stomach pain, very bad back pain, or very bad upset stomach or throwing up.  Dizziness or passing out.  A fast heartbeat.  Change in eyesight.  Low blood sugar can happen. The chance may be raised when this drug is used with other drugs for diabetes. Signs may be dizziness, headache, feeling sleepy or weak, shaking, fast heartbeat, confusion, hunger, or sweating. Call your doctor right away if you have any of these signs. Follow what you have been told to do for low blood sugar. This may include taking glucose tablets, liquid glucose, or some fruit juices. Zepbound:  New or worse behavior or mood changes like depression or thoughts of suicide. What are some other side effects of this drug? All drugs may cause side effects. However, many people have no side effects or only have minor side effects. Call your doctor or get medical help if any of these side effects or any other side effects bother you or do not go away: All products:  Constipation, diarrhea, stomach pain, upset stomach, throwing up, or decreased appetite.  Heartburn.  Pain, itching, or other irritation where the injection was given. Zepbound:  Feeling tired or weak. These are not all of the side effects that may occur. If you have questions about side effects, call your doctor. Call your doctor  for medical advice about side effects. You may report side effects to your national health agency. How is this drug best taken? Use this drug as ordered by your doctor. Read all information given to you. Follow all instructions closely. All products:  It is given as a shot into the fatty part of the skin on the top of the thigh, belly area, or upper arm.  If you will be giving yourself the shot, your doctor or nurse will teach you how to give the shot.  Keep taking this drug as you have been told by your doctor or other health care provider, even if you feel well.  Take the same day each week.  Move site where you give the shot each time.  Take with or without food.  Wash your hands before and after use.  Do not use if the solution is leaking or has particles.  This drug is colorless to a faint yellow. Do not use if the solution changes color.  Do not move this drug from the pen to a syringe.  Each pen or vial is for 1 use only. Throw away any part of the used pen after the dose is given.  Throw away needles in a needle/sharp disposal box. Do not reuse needles or other items. When the box is full, follow all local rules for getting rid of it. Talk with a doctor or pharmacist if you have any questions. If you are using this drug for diabetes:  If you are also using insulin, you may inject this drug and the insulin in the same area of the body but not right next to each other.  Do not mix this drug in the same syringe with insulin. What do I do if I miss a dose?  If it is within 4 days after the missed dose, take the missed dose and go back to your normal day.  If it has been more than 4 days since the missed dose, skip the missed dose and go back to your normal day.  Do not take 2 doses at the same time or extra doses. How do I  store and/or throw out this drug?  Store in a refrigerator. Do not freeze.  Do not use if it has been frozen.  If needed, each pen or vial may be stored at room  temperature for up to 21 days. If you store at room temperature, throw away any part not used after 21 days.  Protect from heat.  Store in the original container to protect from light.  Keep all drugs in a safe place. Keep all drugs out of the reach of children and pets.  Throw away unused or expired drugs. Do not flush down a toilet or pour down a drain unless you are told to do so. Check with your pharmacist if you have questions about the best way to throw out drugs. There may be drug take-back programs in your area. General drug facts  If your symptoms or health problems do not get better or if they become worse, call your doctor.  Do not share your drugs with others and do not take anyone else's drugs.  Some drugs may have another patient information leaflet. If you have any questions about this drug, please talk with your doctor, nurse, pharmacist, or other health care provider.  If you think there has been an overdose, call your poison control center or get medical care right away. Be ready to tell or show what was taken, how much, and when it happened. Last Reviewed Date 2022-11-06 Consumer Information Use and Disclaimer This generalized information is a limited summary of diagnosis, treatment, and/or medication information. It is not meant to be comprehensive and should be used as a tool to help the user understand and/or assess potential diagnostic and treatment options. It does NOT include all information about conditions, treatments, medications, side effects, or risks that may apply to a specific patient. It is not intended to be medical advice or a substitute for the medical advice, diagnosis, or treatment of a health care provider based on the health care provider's examination and assessment of a patient's specific and unique circumstances. Patients must speak with a health care provider for complete information about their health, medical questions, and treatment options, including any  risks or benefits regarding use of medications. This information does not endorse any treatments or medications as safe, effective, or approved for treating a specific patient. UpToDate, Inc. and its affiliates disclaim any warranty or liability relating to this information or the use thereof. The use of this information is governed by the Terms of Use, available at https://www.wolterskluwer.com/en/know/clinical-effectiveness-terms.  2023 UpToDate, Inc. and its affiliates and/or licensors. All rights reserved. Use of UpToDate is subject to the Terms of Use. Topic E246205 Version 16.0

## 2023-09-16 NOTE — Assessment & Plan Note (Signed)
Family h/o CVA. Discussed Wegovy if Zepbound is not approved due to CV risk, nonfatal stroke risk reduction.  For now, we will start a trial of Zepbound.  Counseled on side effects, titration and blackbox warning as a relates to medullary thyroid cancer, multiple endocrine neoplasia.  Patient prefers to continue metformin 2000 mg daily as well.

## 2023-09-20 ENCOUNTER — Encounter: Payer: Self-pay | Admitting: Family

## 2023-09-21 ENCOUNTER — Other Ambulatory Visit (HOSPITAL_COMMUNITY): Payer: Self-pay

## 2023-09-21 ENCOUNTER — Telehealth: Payer: Self-pay

## 2023-09-21 ENCOUNTER — Other Ambulatory Visit: Payer: Self-pay | Admitting: Family

## 2023-09-21 DIAGNOSIS — K1370 Unspecified lesions of oral mucosa: Secondary | ICD-10-CM

## 2023-09-21 DIAGNOSIS — E66811 Obesity, class 1: Secondary | ICD-10-CM

## 2023-09-21 MED ORDER — MAGIC MOUTHWASH W/LIDOCAINE
5.0000 mL | Freq: Three times a day (TID) | ORAL | 0 refills | Status: AC | PRN
Start: 2023-09-21 — End: 2023-10-01

## 2023-09-21 NOTE — Telephone Encounter (Signed)
 PA has been submitted and documented in separate encounter, please sign off on rx in this encounter as PA team is unable to resolve RX requests. Thank you

## 2023-09-21 NOTE — Telephone Encounter (Signed)
Pharmacy Patient Advocate Encounter   Received notification from RX Request Messages that prior authorization for Zepbound 2.5MG /0.5ML pen-injectors is required/requested.   Insurance verification completed.   The patient is insured through [facility] .   Per test claim: PA required; PA submitted to [facility] via CoverMyMeds Key/confirmation #/EOC [id number] Status is pending

## 2023-09-22 NOTE — Telephone Encounter (Signed)
Spoke to pt she is aware.

## 2023-09-22 NOTE — Telephone Encounter (Signed)
Pharmacy Patient Advocate Encounter  Received notification from [facility] that Prior Authorization for Zepbound has been APPROVED from 09/21/2023 to 03/21/2024   PA #/Case ID/Reference #: [id number]

## 2023-10-18 DIAGNOSIS — M1712 Unilateral primary osteoarthritis, left knee: Secondary | ICD-10-CM | POA: Diagnosis not present

## 2023-10-19 ENCOUNTER — Other Ambulatory Visit: Payer: Self-pay

## 2023-10-19 DIAGNOSIS — D500 Iron deficiency anemia secondary to blood loss (chronic): Secondary | ICD-10-CM

## 2023-10-20 ENCOUNTER — Encounter: Payer: Self-pay | Admitting: Gastroenterology

## 2023-10-21 ENCOUNTER — Ambulatory Visit
Admission: RE | Admit: 2023-10-21 | Discharge: 2023-10-21 | Disposition: A | Discharge status: Home / Self Care | Payer: BC Managed Care – PPO | Attending: Gastroenterology | Admitting: Gastroenterology

## 2023-10-21 ENCOUNTER — Encounter: Payer: Self-pay | Admitting: Gastroenterology

## 2023-10-21 ENCOUNTER — Encounter
Admission: RE | Disposition: A | Discharge status: Home / Self Care | Payer: Self-pay | Source: Home / Self Care | Attending: Gastroenterology

## 2023-10-21 ENCOUNTER — Ambulatory Visit: Payer: BC Managed Care – PPO | Admitting: Anesthesiology

## 2023-10-21 DIAGNOSIS — Z0389 Encounter for observation for other suspected diseases and conditions ruled out: Secondary | ICD-10-CM | POA: Diagnosis not present

## 2023-10-21 DIAGNOSIS — D509 Iron deficiency anemia, unspecified: Secondary | ICD-10-CM | POA: Insufficient documentation

## 2023-10-21 DIAGNOSIS — D500 Iron deficiency anemia secondary to blood loss (chronic): Secondary | ICD-10-CM

## 2023-10-21 DIAGNOSIS — Z87891 Personal history of nicotine dependence: Secondary | ICD-10-CM | POA: Diagnosis not present

## 2023-10-21 DIAGNOSIS — K640 First degree hemorrhoids: Secondary | ICD-10-CM | POA: Diagnosis not present

## 2023-10-21 DIAGNOSIS — K449 Diaphragmatic hernia without obstruction or gangrene: Secondary | ICD-10-CM | POA: Insufficient documentation

## 2023-10-21 DIAGNOSIS — F32A Depression, unspecified: Secondary | ICD-10-CM | POA: Insufficient documentation

## 2023-10-21 HISTORY — PX: ESOPHAGOGASTRODUODENOSCOPY (EGD) WITH PROPOFOL: SHX5813

## 2023-10-21 HISTORY — PX: BIOPSY: SHX5522

## 2023-10-21 HISTORY — PX: COLONOSCOPY WITH PROPOFOL: SHX5780

## 2023-10-21 LAB — POCT PREGNANCY, URINE: Preg Test, Ur: NEGATIVE

## 2023-10-21 SURGERY — COLONOSCOPY WITH PROPOFOL
Anesthesia: General

## 2023-10-21 MED ORDER — SODIUM CHLORIDE 0.9 % IV SOLN: INTRAVENOUS | Status: DC

## 2023-10-21 MED ORDER — LIDOCAINE HCL (CARDIAC) PF 100 MG/5ML IV SOSY
PREFILLED_SYRINGE | INTRAVENOUS | Status: DC | PRN
  Administered 2023-10-21: 100 mg via INTRAVENOUS

## 2023-10-21 MED ORDER — DEXMEDETOMIDINE HCL IN NACL 80 MCG/20ML IV SOLN
INTRAVENOUS | Status: DC | PRN
  Administered 2023-10-21: 12 ug via INTRAVENOUS

## 2023-10-21 MED ORDER — PROPOFOL 10 MG/ML IV BOLUS
INTRAVENOUS | Status: DC | PRN
  Administered 2023-10-21: 80 mg via INTRAVENOUS

## 2023-10-21 MED ORDER — PROPOFOL 500 MG/50ML IV EMUL
INTRAVENOUS | Status: DC | PRN
  Administered 2023-10-21: 140 ug/kg/min via INTRAVENOUS

## 2023-10-21 NOTE — Op Note (Signed)
[facility] Gastroenterology Patient Name: Taylor Wagner Procedure Date: 10/21/2023 10:58 AM MRN: [id number] Account #: [id number] Date of Birth: [birth year] Admit Type: Outpatient Age: 47 Room: [facility] ENDO ROOM 4 Gender: Female Note Status: Finalized Instrument Name: Prentice Docker [id number] Procedure:             Colonoscopy Indications:           Iron deficiency anemia Providers:             Midge Minium MD, MD Referring MD:          Lyn Records. Arnett (Referring MD) Medicines:             Propofol per Anesthesia Complications:         No immediate complications. Procedure:             Pre-Anesthesia Assessment:                        - Prior to the procedure, a History and Physical was                         performed, and patient medications and allergies were                         reviewed. The patient's tolerance of previous                         anesthesia was also reviewed. The risks and benefits                         of the procedure and the sedation options and risks                         were discussed with the patient. All questions were                         answered, and informed consent was obtained. Prior                         Anticoagulants: The patient has taken no anticoagulant                         or antiplatelet agents. ASA Grade Assessment: II - A                         patient with mild systemic disease. After reviewing                         the risks and benefits, the patient was deemed in                         satisfactory condition to undergo the procedure.                        After obtaining informed consent, the colonoscope was                         passed under direct vision. Throughout the procedure,  the patient's blood pressure, pulse, and oxygen                         saturations were monitored continuously. The                         Colonoscope was introduced through the anus and                          advanced to the the cecum, identified by appendiceal                         orifice and ileocecal valve. The colonoscopy was                         performed without difficulty. The patient tolerated                         the procedure well. The quality of the bowel                         preparation was excellent. Findings:      The perianal and digital rectal examinations were normal.      Non-bleeding internal hemorrhoids were found during retroflexion. The       hemorrhoids were Grade I (internal hemorrhoids that do not prolapse). Impression:            - Non-bleeding internal hemorrhoids.                        - No specimens collected. Recommendation:        - Discharge patient to home.                        - Resume previous diet.                        - Continue present medications.                        - To visualize the small bowel, perform video capsule                         endoscopy. Procedure Code(s):     --- Professional ---                        [phone], Colonoscopy, flexible; diagnostic, including                         collection of specimen(s) by brushing or washing, when                         performed (separate procedure) Diagnosis Code(s):     --- Professional ---                        D50.9, Iron deficiency anemia, unspecified CPT copyright 2022 American Medical Association. All rights reserved. The codes documented in this report are preliminary and upon coder review may  be revised to meet current compliance requirements. Midge Minium MD, MD 10/21/2023 11:30:42 AM  This report has been signed electronically. Number of Addenda: 0 Note Initiated On: 10/21/2023 10:58 AM Scope Withdrawal Time: 0 hours 9 minutes 13 seconds  Total Procedure Duration: 0 hours 14 minutes 30 seconds  Estimated Blood Loss:  Estimated blood loss: none.      [facility]

## 2023-10-21 NOTE — Op Note (Signed)
[facility] Gastroenterology Patient Name: Taylor Wagner Procedure Date: 10/21/2023 10:59 AM MRN: [id number] Account #: [id number] Date of Birth: [birth year] Admit Type: Outpatient Age: 47 Room: [facility] ENDO ROOM 4 Gender: Female Note Status: Finalized Instrument Name: Patton Salles Endoscope [id number] Procedure:             Upper GI endoscopy Indications:           Iron deficiency anemia Providers:             Midge Minium MD, MD Referring MD:          Lyn Records. Arnett (Referring MD) Medicines:             Propofol per Anesthesia Complications:         No immediate complications. Procedure:             Pre-Anesthesia Assessment:                        - Prior to the procedure, a History and Physical was                         performed, and patient medications and allergies were                         reviewed. The patient's tolerance of previous                         anesthesia was also reviewed. The risks and benefits                         of the procedure and the sedation options and risks                         were discussed with the patient. All questions were                         answered, and informed consent was obtained. Prior                         Anticoagulants: The patient has taken no anticoagulant                         or antiplatelet agents. ASA Grade Assessment: II - A                         patient with mild systemic disease. After reviewing                         the risks and benefits, the patient was deemed in                         satisfactory condition to undergo the procedure.                        After obtaining informed consent, the endoscope was                         passed under direct vision. Throughout the procedure,  the patient's blood pressure, pulse, and oxygen                         saturations were monitored continuously. The Endoscope                         was introduced through the mouth,  and advanced to the                         second part of duodenum. The upper GI endoscopy was                         accomplished without difficulty. The patient tolerated                         the procedure well. Findings:      A small hiatal hernia was present.      The stomach was normal.      The examined duodenum was normal.      Biopsies were taken with a cold forceps in the entire duodenum for       histology. Impression:            - Small hiatal hernia.                        - Normal stomach.                        - Normal examined duodenum.                        - Biopsies were taken with a cold forceps for                         histology in the entire duodenum. Recommendation:        - Discharge patient to home.                        - Resume previous diet.                        - Continue present medications.                        - Await pathology results. Procedure Code(s):     --- Professional ---                        [phone], Esophagogastroduodenoscopy, flexible,                         transoral; with biopsy, single or multiple Diagnosis Code(s):     --- Professional ---                        D50.9, Iron deficiency anemia, unspecified CPT copyright 2022 American Medical Association. All rights reserved. The codes documented in this report are preliminary and upon coder review may  be revised to meet current compliance requirements. Midge Minium MD, MD 10/21/2023 11:12:43 AM This report has been signed electronically. Number of Addenda: 0 Note Initiated On: 10/21/2023 10:59 AM Estimated Blood Loss:  Estimated  blood loss: none.      [facility]

## 2023-10-21 NOTE — Anesthesia Preprocedure Evaluation (Signed)
Anesthesia Evaluation  Patient identified by MRN, date of birth, ID band Patient awake    Reviewed: Allergy & Precautions, NPO status , Patient's Chart, lab work & pertinent test results  Airway Mallampati: II  TM Distance: >3 FB Neck ROM: Full    Dental  (+) Teeth Intact   Pulmonary neg pulmonary ROS, former smoker   Pulmonary exam normal breath sounds clear to auscultation       Cardiovascular Exercise Tolerance: Good negative cardio ROS Normal cardiovascular exam Rhythm:Regular Rate:Normal     Neuro/Psych    Depression    negative neurological ROS  negative psych ROS   GI/Hepatic negative GI ROS, Neg liver ROS,,,  Endo/Other  negative endocrine ROS    Renal/GU negative Renal ROS  negative genitourinary   Musculoskeletal   Abdominal  (+) + obese  Peds negative pediatric ROS (+)  Hematology negative hematology ROS (+) Blood dyscrasia, anemia   Anesthesia Other Findings Past Medical History: No date: ADD (attention deficit disorder with hyperactivity) No date: Allergy No date: Depression  Past Surgical History: 03/27/2021: BREAST BIOPSY; Left     Comment:  stereo bx ribbon clip BENIGN BREAST TISSUE, MOSTLY               FIBROUS, No date: CESAREAN SECTION     Comment:  x3 No date: FOOT SURGERY     Comment:  pin removal No date: TUBAL LIGATION  BMI    Body Mass Index: 40.85 kg/m      Reproductive/Obstetrics negative OB ROS                             Anesthesia Physical Anesthesia Plan  ASA: 2  Anesthesia Plan: General   Post-op Pain Management:    Induction: Intravenous  PONV Risk Score and Plan: Propofol infusion and TIVA  Airway Management Planned: Natural Airway and Nasal Cannula  Additional Equipment:   Intra-op Plan:   Post-operative Plan:   Informed Consent: I have reviewed the patients History and Physical, chart, labs and discussed the procedure  including the risks, benefits and alternatives for the proposed anesthesia with the patient or authorized representative who has indicated his/her understanding and acceptance.     Dental Advisory Given  Plan Discussed with: CRNA and Surgeon  Anesthesia Plan Comments:        Anesthesia Quick Evaluation

## 2023-10-21 NOTE — Anesthesia Postprocedure Evaluation (Signed)
Anesthesia Post Note  Patient: Taylor Wagner  Procedure(s) Performed: COLONOSCOPY WITH PROPOFOL ESOPHAGOGASTRODUODENOSCOPY (EGD) WITH PROPOFOL BIOPSY  Patient location during evaluation: PACU Anesthesia Type: General Level of consciousness: awake and awake and alert Pain management: satisfactory to patient Vital Signs Assessment: post-procedure vital signs reviewed and stable Respiratory status: spontaneous breathing Cardiovascular status: stable Anesthetic complications: no   No notable events documented.   Last Vitals:  Vitals:   10/21/23 1143 10/21/23 1149  BP: 131/86 (!) 136/96  Pulse: 84 84  Resp: 17   Temp:    SpO2: 100% 100%    Last Pain:  Vitals:   10/21/23 1143  TempSrc:   PainSc: 0-No pain                 VAN STAVEREN,Katheryne Gorr

## 2023-10-21 NOTE — Transfer of Care (Signed)
Immediate Anesthesia Transfer of Care Note  Patient: Taylor Wagner  Procedure(s) Performed: COLONOSCOPY WITH PROPOFOL ESOPHAGOGASTRODUODENOSCOPY (EGD) WITH PROPOFOL BIOPSY  Patient Location: PACU  Anesthesia Type:General  Level of Consciousness: awake, alert , and oriented  Airway & Oxygen Therapy: Patient Spontanous Breathing  Post-op Assessment: Report given to RN and Post -op Vital signs reviewed and stable  Post vital signs: Reviewed and stable  Last Vitals:  Vitals Value Taken Time  BP 126/76 10/21/23 1133  Temp    Pulse 102 10/21/23 1133  Resp 20 10/21/23 1133  SpO2 100 % 10/21/23 1133    Last Pain:  Vitals:   10/21/23 1133  TempSrc:   PainSc: Asleep         Complications: No notable events documented.

## 2023-10-21 NOTE — H&P (Signed)
Taylor Minium, MD [facility] [address] [place], [place] [address] Phone:[phone] Fax : [phone]  Primary Care Physician:  Allegra Grana, FNP Primary Gastroenterologist:  Dr. Servando Snare  Pre-Procedure History & Physical: HPI:  Taylor Wagner is a 47 y.o. female is here for an endoscopy and colonoscopy.   Past Medical History:  Diagnosis Date   ADD (attention deficit disorder with hyperactivity)    Allergy    Depression     Past Surgical History:  Procedure Laterality Date   BREAST BIOPSY Left 03/27/2021   stereo bx ribbon clip BENIGN BREAST TISSUE, MOSTLY FIBROUS,   CESAREAN SECTION     x3   FOOT SURGERY     pin removal   TUBAL LIGATION      Prior to Admission medications   Medication Sig Start Date End Date Taking? Authorizing Provider  B-D 3CC LUER-LOK SYR 25GX5/8" 25G X 5/8" 3 ML MISC USED TO GIVE MONTHLY B-12 INJECTIONS. 12/25/20  Yes [provider]  CALCIUM-MAGNESIUM PO Take by mouth.   Yes [provider]  cyanocobalamin (,VITAMIN B-12,) 1000 MCG/ML injection INJECT 1000 MCG INTO THE MUSCLE ONCE A WEEK FOR 4 WEEKS THEN ONCE MONTHLY THEREAFTER. 12/30/21  Yes Arnett, Lyn Records, FNP  ferrous sulfate 325 (65 FE) MG tablet Take 325 mg by mouth daily with breakfast.   Yes [provider]  Multiple Vitamin (MULTIVITAMIN) capsule Take 1 capsule by mouth daily.   Yes [provider]  B-D 3CC LUER-LOK SYR 25GX1" 25G X 1" 3 ML MISC USED TO GIVE MONTHLY B-12 INJECTIONS. 12/24/20   [provider]  buPROPion (WELLBUTRIN XL) 300 MG 24 hr tablet TAKE 1 TABLET BY MOUTH EVERY DAY 08/04/23   Allegra Grana, FNP  EPINEPHrine 0.3 mg/0.3 mL IJ SOAJ injection Inject 0.3 mLs (0.3 mg total) into the muscle as needed for anaphylaxis. 08/09/20   Allegra Grana, FNP  metFORMIN (GLUCOPHAGE-XR) 500 MG 24 hr tablet Take 4 tablets (2,000 mg total) by mouth every evening. 09/16/23   Allegra Grana, FNP  NEEDLE, DISP, 25 G (B-D  DISP NEEDLE 25GX1") 25G X 1" MISC Used to give monthly B-12 injections. 12/24/20   Allegra Grana, FNP  SYRINGE/NEEDLE, DISP, 1 ML (BD ECLIPSE SYRINGE) 25G X 5/8" 1 ML MISC Used to give monthly B-12 injections. 12/24/20   Allegra Grana, FNP  ZEPBOUND 2.5 MG/0.5ML injection vial INJECT 2.5 MG INTO THE SKIN ONCE A WEEK 09/21/23   Allegra Grana, FNP    Allergies as of 10/19/2023 - Review Complete 09/16/2023  Allergen Reaction Noted   Beef-derived products  02/26/2015   Erythromycin Hives 08/20/2011   Pork-derived products  02/26/2015    Family History  Problem Relation Age of Onset   Diabetes Mother    Stroke Father 44   Stroke Maternal Grandfather        several strokes   Stroke Paternal Grandfather    Stroke Maternal Aunt    Diabetes Maternal Aunt    Breast cancer Neg Hx    Colon cancer Neg Hx    Thyroid cancer Neg Hx     Social History   Socioeconomic History   Marital status: Married    Spouse name: Not on file   Number of children: Not on file   Years of education: Not on file   Highest education level: Some college, no degree  Occupational History   Not on file  Tobacco Use   Smoking status: Former  Smokeless tobacco: Never   Tobacco comments:    quit 2020  Substance and Sexual Activity   Alcohol use: Yes    Comment: occasional   Drug use: No   Sexual activity: Not on file  Other Topics Concern   Not on file  Social History Narrative   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   Social Determinants of Health   Financial Resource Strain: Low Risk  (06/15/2023)   Overall Financial Resource Strain (CARDIA)    Difficulty of Paying Living Expenses: Not hard at all  Food Insecurity: No Food Insecurity (06/15/2023)   Hunger Vital Sign    Worried About Running Out of Food in the Last Year: Never true    Ran Out of Food in the Last Year: Never true  Transportation Needs: No Transportation Needs (06/15/2023)   PRAPARE - Administrator, Civil Service  (Medical): No    Lack of Transportation (Non-Medical): No  Physical Activity: Insufficiently Active (06/15/2023)   Exercise Vital Sign    Days of Exercise per Week: 2 days    Minutes of Exercise per Session: 30 min  Stress: No Stress Concern Present (06/15/2023)   [employer] of Occupational Health - Occupational Stress Questionnaire    Feeling of Stress : Only a little  Social Connections: Moderately Isolated (06/15/2023)   Social Connection and Isolation Panel [NHANES]    Frequency of Communication with Friends and Family: Once a week    Frequency of Social Gatherings with Friends and Family: Once a week    Attends Religious Services: 1 to 4 times per year    Active Member of [employer] or Organizations: No    Attends Engineer, structural: Not on file    Marital Status: Married  Catering manager Violence: Not on file    Review of Systems: See HPI, otherwise negative ROS  Physical Exam: BP (!) 151/93   Pulse (!) 101   Temp (!) 96.9 F (36.1 C) (Temporal)   Resp 20   Ht 5\' 3"  (1.6 m)   Wt 104.6 kg   SpO2 100%   BMI 40.85 kg/m  General:   Alert,  pleasant and cooperative in NAD Head:  Normocephalic and atraumatic. Neck:  Supple; no masses or thyromegaly. Lungs:  Clear throughout to auscultation.    Heart:  Regular rate and rhythm. Abdomen:  Soft, nontender and nondistended. Normal bowel sounds, without guarding, and without rebound.   Neurologic:  Alert and  oriented x4;  grossly normal neurologically.  Impression/Plan: Taylor Wagner is here for an endoscopy and colonoscopy to be performed for IDA  Risks, benefits, limitations, and alternatives regarding  endoscopy and colonoscopy have been reviewed with the patient.  Questions have been answered.  All parties agreeable.   Taylor Minium, MD  10/21/2023, 10:45 AM

## 2023-10-22 LAB — SURGICAL PATHOLOGY

## 2023-10-25 ENCOUNTER — Encounter: Payer: Self-pay | Admitting: Gastroenterology

## 2023-10-25 ENCOUNTER — Encounter: Payer: Self-pay | Admitting: Family

## 2023-10-26 ENCOUNTER — Other Ambulatory Visit: Payer: Self-pay | Admitting: Family

## 2023-10-26 DIAGNOSIS — F419 Anxiety disorder, unspecified: Secondary | ICD-10-CM

## 2023-10-26 MED ORDER — FLUOXETINE HCL 20 MG PO CAPS: 20.0000 mg | ORAL_CAPSULE | Freq: Every morning | ORAL | 3 refills | Status: DC

## 2023-10-27 ENCOUNTER — Other Ambulatory Visit: Payer: Self-pay | Admitting: Family

## 2023-10-27 DIAGNOSIS — F909 Attention-deficit hyperactivity disorder, unspecified type: Secondary | ICD-10-CM

## 2023-12-17 DIAGNOSIS — M25562 Pain in left knee: Secondary | ICD-10-CM | POA: Diagnosis not present

## 2023-12-29 ENCOUNTER — Other Ambulatory Visit: Payer: Self-pay | Admitting: Medical Genetics

## 2024-02-13 DIAGNOSIS — J069 Acute upper respiratory infection, unspecified: Secondary | ICD-10-CM | POA: Diagnosis not present

## 2024-02-22 ENCOUNTER — Other Ambulatory Visit (HOSPITAL_COMMUNITY): Payer: Self-pay

## 2024-04-26 IMAGING — DX DG SHOULDER 2+V*L*
3 series · 3 of 3 positions shown · non-contrast
Comparison: None Available.

CLINICAL DATA: Anterior shoulder pain.

EXAM:
LEFT SHOULDER - 2+ VIEW

[shoulder (grashey) ap]
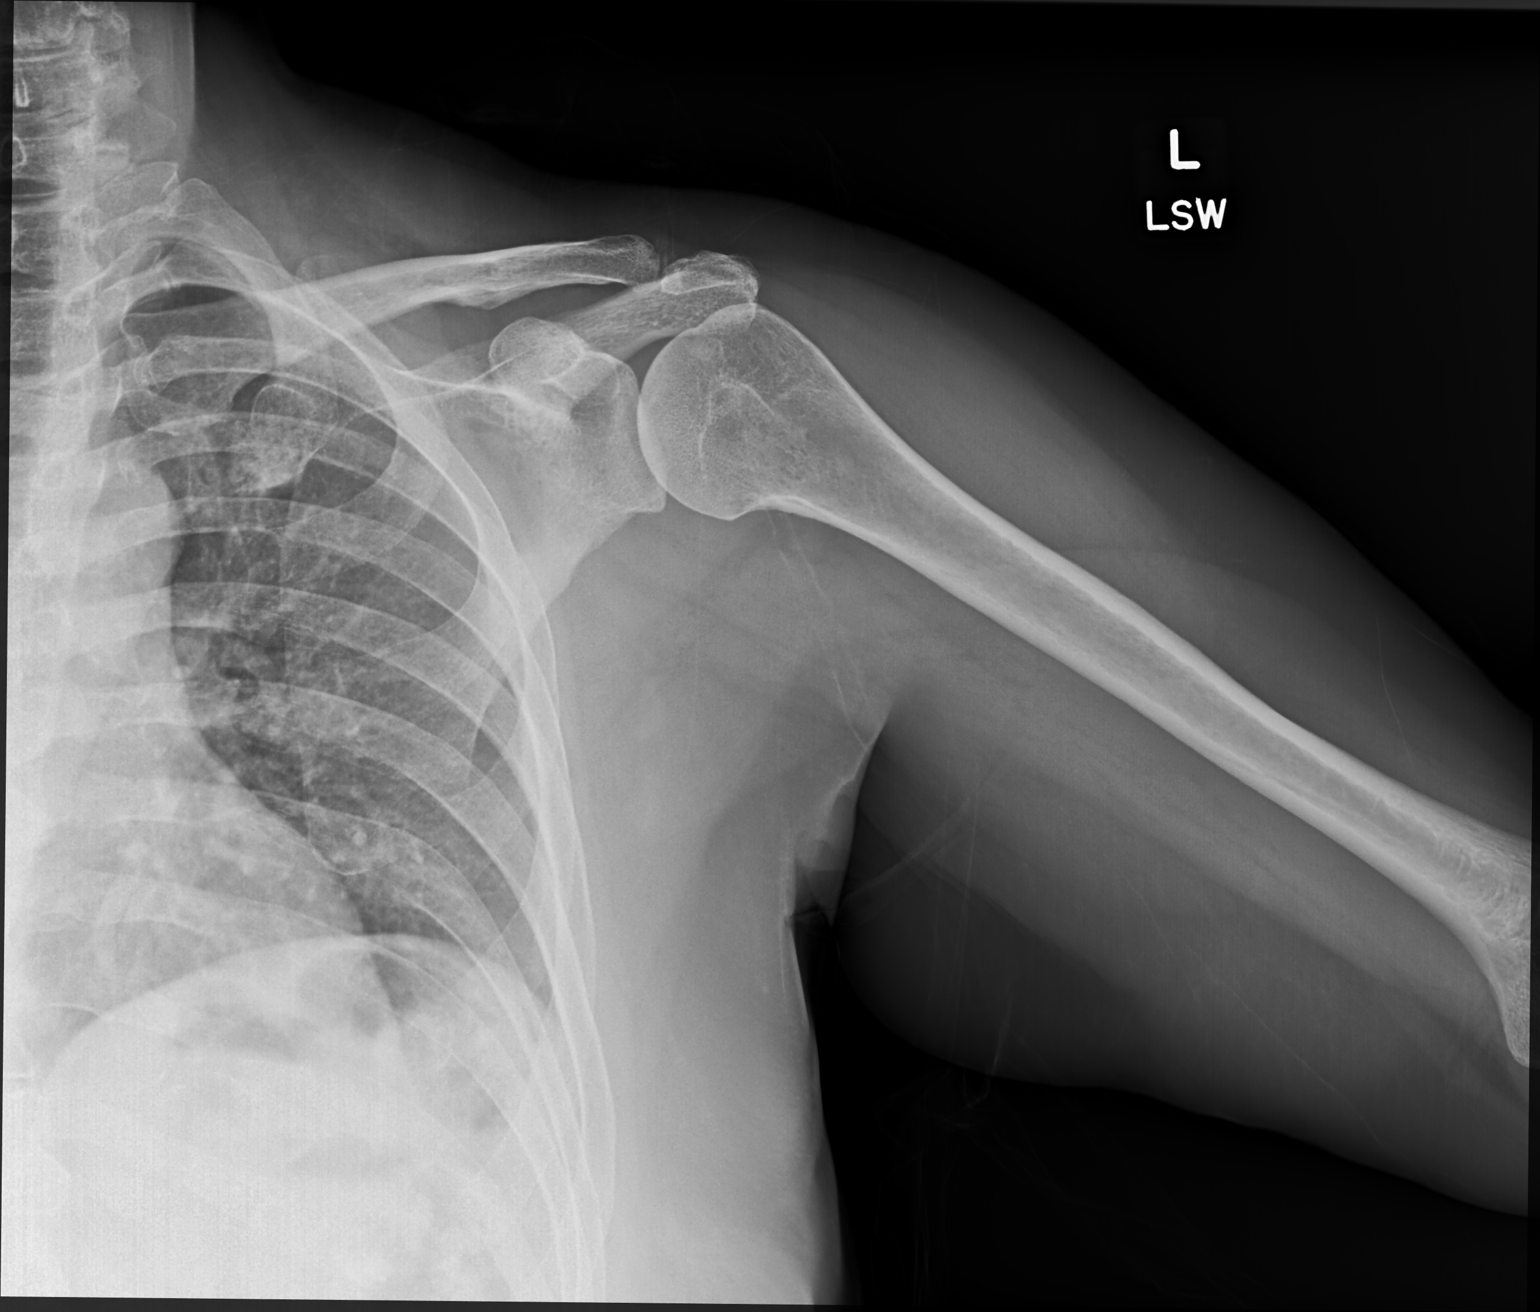

[shoulder y view]
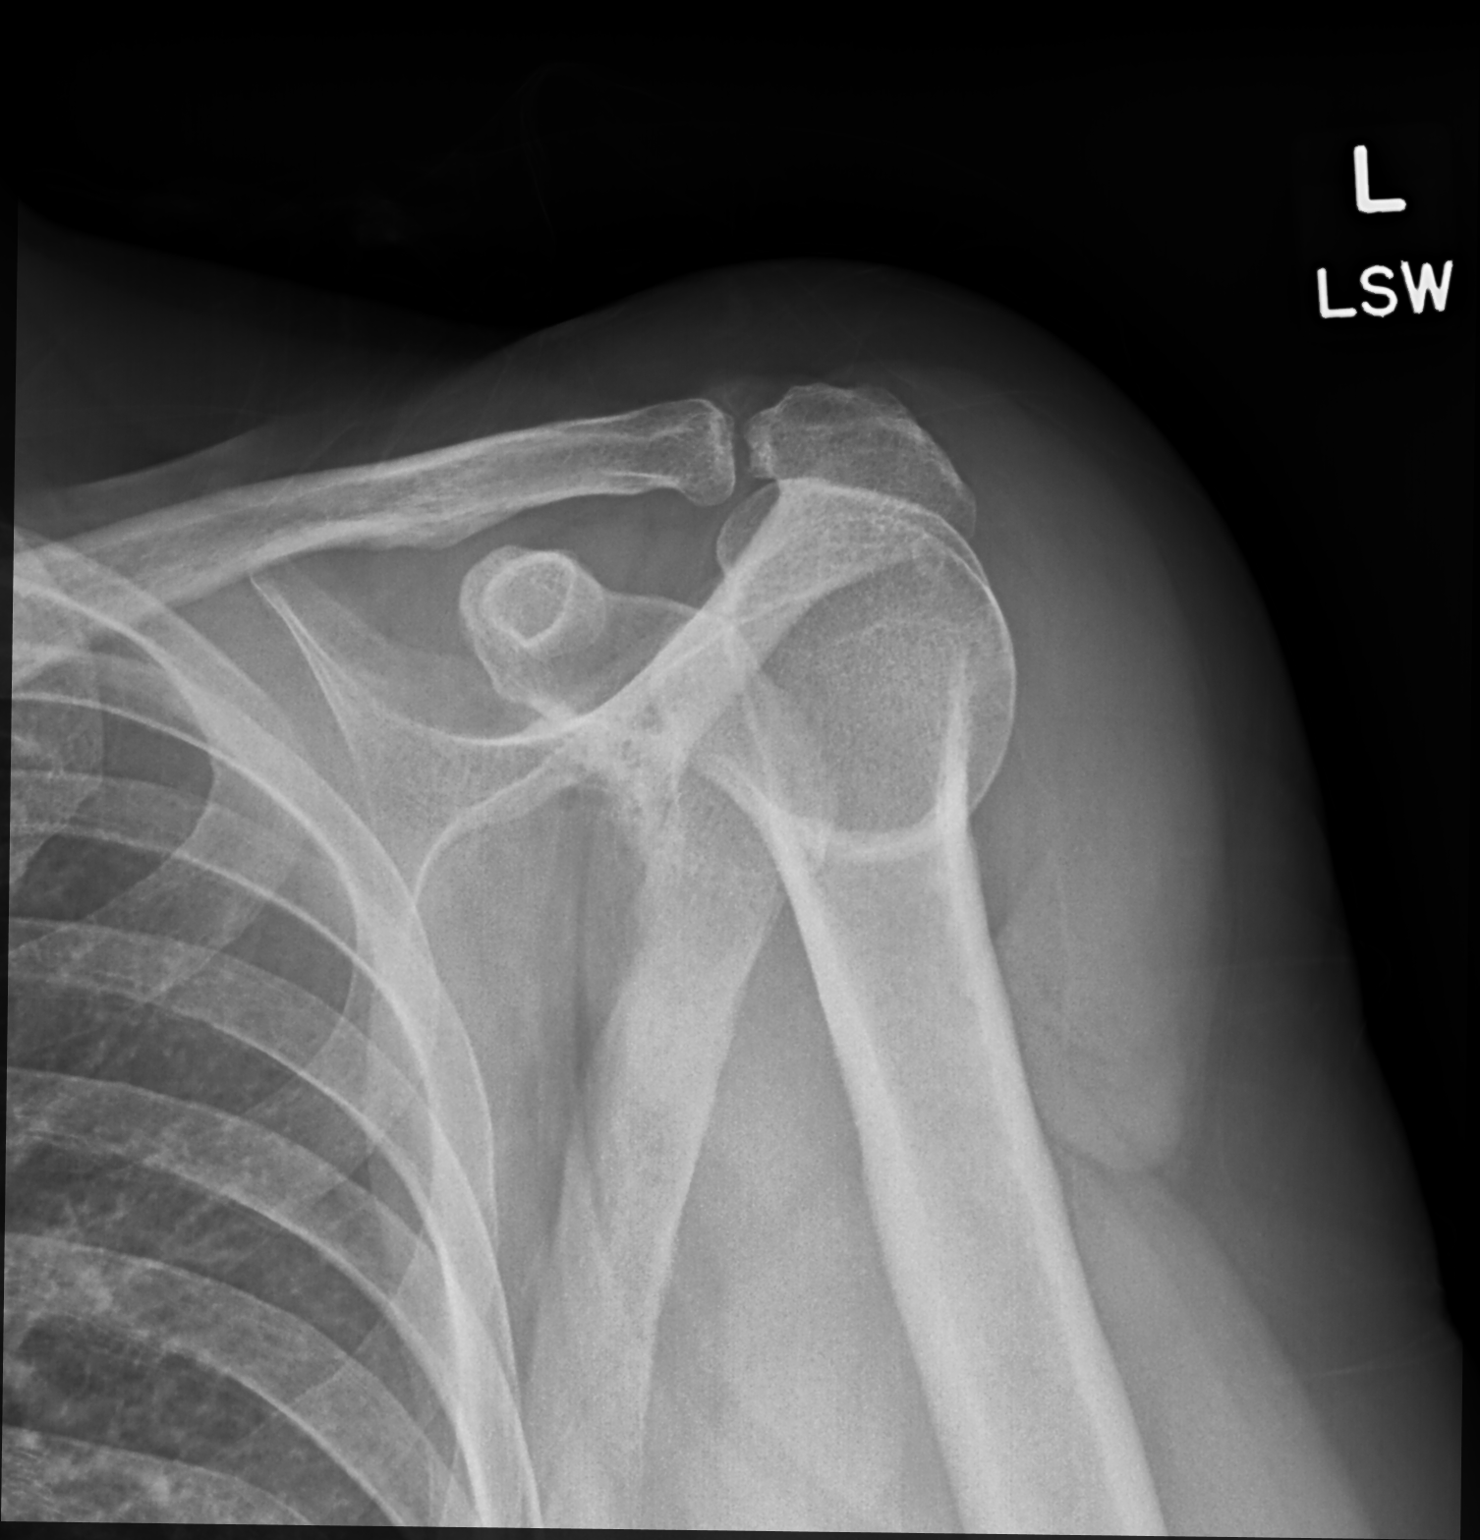

[shoulder axillary pa]
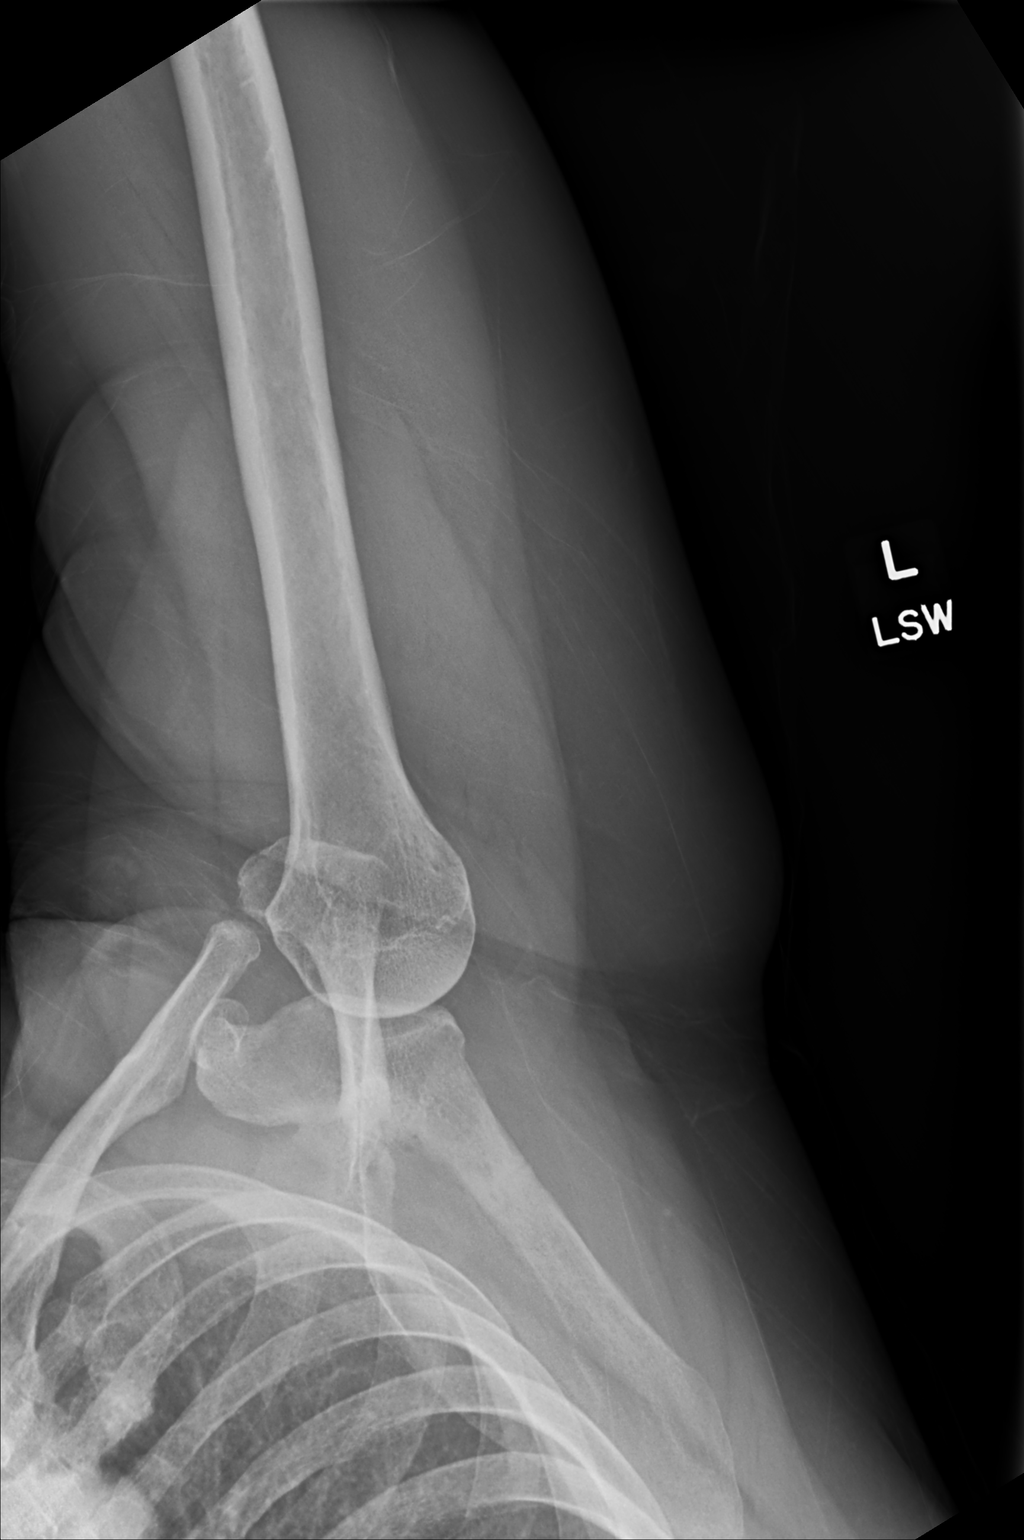

[3 of 3 positions shown; findings below may reference images not displayed]

FINDINGS: No fracture or dislocation. Mild AC joint degenerative changes. No
other abnormalities.
IMPRESSION: Mild AC joint degenerative changes.  No other abnormalities.

## 2024-05-06 ENCOUNTER — Other Ambulatory Visit: Payer: Self-pay | Admitting: Family

## 2024-05-06 DIAGNOSIS — E66811 Obesity, class 1: Secondary | ICD-10-CM

## 2024-06-02 ENCOUNTER — Ambulatory Visit (INDEPENDENT_AMBULATORY_CARE_PROVIDER_SITE_OTHER): Admitting: Psychology

## 2024-06-02 DIAGNOSIS — F89 Unspecified disorder of psychological development: Secondary | ICD-10-CM | POA: Diagnosis not present

## 2024-06-02 NOTE — Progress Notes (Signed)
 Date: 06/02/2024 Appointment Start Time: 9am Duration: 75 minutes Provider: Maryetta Sneddon, PsyD Type of Session: Initial Appointment for Evaluation  Location of Patient: Parked in car in a private location in [place] Location of Provider: Provider's Home (private office) Type of Contact: Caregility video visit with audio  Session Content:  Prior to proceeding with today's appointment, two pieces of identifying information were obtained from [place] to verify identity. In addition, Skylor's physical location at the time of this appointment was obtained. In the event of technical difficulties, Aneka shared a phone number she could be reached at. Kayra and this provider participated in today's telepsychological service. Gazella denied anyone else being present in the room or on the virtual appointment.  The provider's role was explained to [place]. The provider reviewed and discussed issues of confidentiality, privacy, and limits therein (e.g., reporting obligations). In addition to verbal informed consent, written informed consent for psychological services was obtained from [place] prior to the initial appointment. Written consent included information concerning the practice, financial arrangements, and confidentiality and patients' rights. Since the clinic is not a 24/7 crisis center, mental health emergency resources were shared, and the provider explained e-mail, voicemail, and/or other messaging systems should be utilized only for non-emergency reasons. This provider also explained that information obtained during appointments will be placed in their electronic medical record in a confidential manner. Analia verbally acknowledged understanding of the aforementioned and agreed to use mental health emergency resources discussed if needed. Moreover, Simmone agreed information may be shared with other [facility] or their referring provider(s) as needed for coordination of care. By signing the new  patient documents, Shanai provided written consent for coordination of care. Blen verbally acknowledged understanding she is ultimately responsible for understanding her insurance benefits as it relates to reimbursement of telepsychological and in-person services. This provider also reviewed confidentiality, as it relates to telepsychological services, as well as the rationale for telepsychological services. This provider further explained that video should not be captured, photos should not be taken, nor should testing stimuli be copied or recorded as it would be a copyright violation. Meera expressed understanding of the aforementioned, and verbally consented to proceed. Rochell is aware of the limitations of teleheath visits and verbally consented to proceed.  Cai completed the psychiatric diagnostic evaluation (history, including past, family, social, and  psychiatric history; behavioral observations; and establishment of a provisional diagnosis). The evaluation was completed in 75 minutes. Code 98119 was billed.   Mental Status Examination:  Appearance:  neat Behavior: appropriate to circumstances Mood: anxious Affect: mood congruent Speech: pressured Eye Contact: appropriate Psychomotor Activity: restless Thought Process: denies suicidal, homicidal, and self-harm ideation, plan and intent Content/Perceptual Disturbances: none Orientation: AAOx4 Cognition/Sensorium: intact Insight: fair Judgment: good  Provisional DSM-5 diagnosis(es):  F89 Unspecified Disorder of Psychological Development   Plan: Testing is expected to answer the question, does the individual meet criteria for ADHD when age, other mental health concerns (e.g., depression and anxiety), and cognitive functioning are taken into consideration? Further testing is warranted because a diagnosis cannot be given solely based on current interview data (further data is required). Testing results are expected to answer  the remaining diagnostic questions in order to provide an accurate diagnosis and assist in treatment planning with an expectation of improved clinical outcome. Jaci is currently scheduled for an appointment on 06/21/2024 at 10am via Caregility video visit with audio.        CONFIDENTIAL PSYCHOLOGICAL EVALUATION ______________________________________________________________________________ Name: Marcene Serve Date of Birth: [birth year]  Age: 76  SOURCE AND REASON FOR REFERRAL: Taylor Wagner was referred by FNP Margart Arnett for an evaluation to ascertain if she meets the criteria for Attention Deficit/Hyperactivity Disorder (ADHD).    BACKGROUND INFORMATION AND PRESENTING PROBLEM: Ms. Taylor Wagner is a 48 year old female who resides in [place] .  Taylor Wagner reported she is "positive [she has] some form of ADD" and that it has "just gotten progressively worse as Taylor Wagner has] gotten older" to the point it is "almost debilitating." She noted uncertainty about what is causing this exacerbation and denied being aware of any significant life changes that have occurred. She further reported she has been utilizing Prozac  and Wellbutrin , and that she has "been on [a psychotropic medication] for at least 20 years," and that while medication has been helpful for mood, various ADHD-related symptoms have persisted. She endorsed the following ADHD-related symptoms:  Symptoms Frequency   Often Occasionally Infrequent/No Significant Issues  Inattention Criterion (DSM-5-TR)     Making careless mistakes and missing small details.   She noted this symptom occasionally occurs.    Being easily distracted by various stimuli and the mind often being elsewhere even when no apparent distractions exist.  She described being easily distracted by various stimuli (e.g., irrelevant tasks and thoughts), noting it occurs even when they are no obvious distractions and that it contributes to task maintenance issues.      Trouble sustaining attention during conversations.    She denied experiencing significant issues with this symptom.  Does not follow through on instructions and fails to finish tasks.  She endorsed commonly experiencing task initiation and maintenance issues.     Difficulty organizing tasks and activities.  She described regularly having clutter in her environment, task maintenance issues, prioritization problems, trouble determining where to start on tasks and with planning.      Avoids, dislikes, or is reluctant to engage in tasks that require sustained mental effort. She stated she tends to put off tasks until last minute, especially if it is a "big task."    Loses things necessary for tasks or activities.    She denied experiencing significant issues with this symptom.  Forgetful in daily activities.    She denied experiencing significant issues with this symptom.   Hyperactivity and Impulsivity Criterion (DSM-5-TR)     Fidgets with hands or feet or squirms in seat. She described her body as consistently moving and fidgeting.     Leaves seat in situations in which remaining seated is expected.   She denied experiencing significant issues with this symptom.   Feelings of restlessness. She reported she often feels restless.     Difficulty playing or engaging in leisure activities quietly.   She denied experiencing significant issues with this symptom.  "On the go" or often acts as if "driven by a motor."   She denied experiencing significant issues with this symptom.  Talks excessively.  She shared that she is prone to "overexplaining" and providing excessive details while speaking. She also shared that her mind regularly "move[s] quick" and that she is trying to keep up with it, which contributes to the aforementioned difficulty.     Interrupts others.  She stated her mind "going faster" leads to her interrupting others despite efforts to not interrupt others.     Impatience.    She denied  experiencing significant issues with this symptom.  ADHD-Related Symptoms that Assist in Identifying ADHD but are not in the DSM-5-TR Arvil Birks, 2021, p. 6-12 and [phone])  Emotional dysregulation and overstimulation. She shared she is prone to feelings of overwhelm if experiencing multiple pulls on her attention.    Making decisions impulsively.   She denied experiencing significant issues with this symptom.  Tending to drive much faster than others.   She denied experiencing significant issues with this symptom.  Trouble following through on promises or commitments made to others.   She noted her completion of promises is occasionally "delayed" but that she tends to follow through with them.     Trouble doing things in proper order or sequence.   She denied experiencing significant issues with this symptom.   Taylor Wagner discussed a history of periods of depressed mood and use of an antidepressant; generalized anxiety that occurs less days than not and that she was "more anxious" when younger; social anxiety-related symptoms (e.g., concerns about how others perceive her that causes modest distress); emotional eating-related behaviors (e.g., eating when bored but not physically hungry); childhood neglect (e.g., receiving limited affection from her mother); a period of suicidal ideation without plan or intent that she attributed to a psychotropic medication change; She expressed a belief that her ADHD-related symptoms are consistent across situations and independent of mood. However, she noted that she does not remember inattention being a significant issue as a child, but that other difficulties, such as disorganization and task initiation issues, have "always been there."     Taylor Wagner denied awareness of having ever experienced any developmental milestone delays, grade retention, learning disability diagnosis, or having an individualized education plan. She discussed that she received "Cs, Bs, and  occasionally As" throughout primary and secondary schooling. She also discussed that she struggled with mathematics as she "d[id not] get it" throughout schooling. However, she indicated that she does better utilizing mathematics in real-world scenarios (e.g., at her workplace) She stated she "probably daydreamed" occasionally throughout schooling, but not to an extent that caused significant issues She noted she completed a "few [college] classes," but ceased college as she "got bored" and had trouble determining what she "wanted to do." She further noted her college grades were "okay." She stated she has been employed with [employer] in various positions for 17 years, sharing that she tends to "stay with jobs for a while." She denied having a history of employment disciplinary actions.    Taylor Wagner reported her medical history is significant for her being "overweight" and having "arthritis in [her] knee." She denied awareness of ever experiencing seizures, head injury, or sleep apnea-related symptoms. She reported that her mental health history is significant for the use of psychotropic medication. She also reported use of two 200mg  "caffeine pills" daily, alcohol use approximately three times a year, and having ceased use of tobacco four or five years ago. She denied use of all other recreational or illicit substances. She also denied ever utilizing psychotherapeutic services, being psychiatrically hospitalized, or having a history of legal involvement. She also denied ever meeting the full criteria for hypomanic or manic episode; autism spectrum disorder; obsessions and compulsions; sleep-wake disorder; and schizophrenia spectrum and related disorder, as well as having ever experienced homicidal ideation, plan, or intent. She stated her familial mental health history is significant for "PTSD," "depression," "binge eating," and possible ADHD (brother); "PTSD" (cousin); and ADHD (daughter).              Veronica Gordon, PsyD

## 2024-06-21 ENCOUNTER — Ambulatory Visit: Admitting: Psychology

## 2024-06-21 DIAGNOSIS — F89 Unspecified disorder of psychological development: Secondary | ICD-10-CM

## 2024-06-21 NOTE — Progress Notes (Signed)
 Date: 06/21/2024   Appointment Start Time: 10:04am Duration: 58 minutes Provider: Frederic Fire, PsyD Type of Session: Testing Appointment for Evaluation  Location of Patient: Home Location of Provider: Provider's Home (private office) Type of Contact: Caregility video visit with audio  Session Content: Today's appointment was a telepsychological visit. Leaha is aware it is her responsibility to secure confidentiality on her end of the session. Prior to proceeding with today's appointment, Kayton's physical location at the time of this appointment was obtained as well a phone number she could be reached at in the event of technical difficulties. Tenesia denied anyone else being present in the room or on the virtual appointment. This provider reviewed that video should not be captured, photos should not be taken, nor should testing stimuli be copied or recorded as it would be a copyright violation. Nechuma expressed understanding of the aforementioned, and verbally consented to proceed. The WAIS-5 was administered, scored, and interpreted by this evaluator. Maleni is aware of the limitations of teleheath visits and verbally consented to proceed.  Billing codes will be input on the feedback appointment. There are no billing codes for the testing appointment.   Provisional DSM-5 diagnosis(es):  F89 Unspecified Disorder of Psychological Development   Plan: Kimiya was scheduled for a feedback appointment on 06/29/2024 at 9am via Caregility video visit with audio.                Frederic ONEIDA Fire, PsyD

## 2024-06-29 ENCOUNTER — Ambulatory Visit (INDEPENDENT_AMBULATORY_CARE_PROVIDER_SITE_OTHER): Admitting: Psychology

## 2024-06-29 DIAGNOSIS — F908 Attention-deficit hyperactivity disorder, other type: Secondary | ICD-10-CM | POA: Diagnosis not present

## 2024-06-29 NOTE — Progress Notes (Signed)
 Testing and Report Writing Information: The following measures  were administered, scored, and interpreted by this provider:  Generalized Anxiety Disorder-7 (GAD-7; 5 minutes), Patient Health Questionnaire-9 (PHQ-9; 5 minutes), Wechsler Adult Intelligence Scale-Fourth Edition (WAIS-5; 70 minutes), CNS Vital Signs (45 minutes), Adult Attention Deficit/Hyperactivity Disorder Self-Report Scale Checklist (ASRSv1.1; 15 minutes), Behavior Rating Inventory for Executive Function - 2A - Self Report (BRIEF 2A; 10 minutes) and Behavior Rating Inventory for Executive Function - 2A - Informant  (BRIEF-2A; 10 minutes), and Personality Assessment Inventory (PAI; 50 minutes). A total of 210 minutes was spent on the administration and scoring of the aforementioned measures. Codes 03863 and [phone] (6 units) were billed.  Please see the assessment for additional details. This provider completed the written report which includes integration of patient data, interpretation of standardized test results, interpretation of clinical data, review of information provided by Suzen and any collateral information/documentation, and clinical decision making (340 minutes in total).  Feedback Appointment: Date: 06/29/2024 Appointment Start Time: 9:02am Duration: 43 minutes Provider: Frederic Fire, PsyD Type of Session: Feedback Appointment for Evaluation  Location of Patient: Home Location of Provider: Provider's Home (private office) Type of Contact: Caregility video visit with audio  Session Content: Today's appointment was a telepsychological visit due to COVID-19. Gurtha is aware it is her responsibility to secure confidentiality on her end of the session. She provided verbal consent to proceed with today's appointment. Prior to proceeding with today's appointment, Jiana's physical location at the time of this appointment was obtained as well a phone number she could be reached at in the event of technical difficulties.  Nicolas denied anyone else being present in the room or on the virtual appointment. Shanyn is aware of the limitations of teleheath visits and verbally consented to proceed.  This provider and Suzen completed the interactive feedback session which includes reviewing the aforementioned measures, treatment recommendations, and diagnostic conclusions.   The interactive feedback session was completed today and a total of 43 minutes was spent on feedback. Code 03867 was billed for feedback session.   DSM-5 Diagnosis(es):  F90.8 Other Specified Attention-Deficit/Hyperactivity Disorder, with insufficient inattention and hyperactivity and impulsivity symptoms appears warranted  Time Requirements: Assessment scoring and interpreting: 210 minutes (billing code 03863 and [id number] [6 units]) Feedback: 43 minutes (billing code 03867) Report writing: 340 total minutes. 06/04/2024: 12:50-1:10pm and 1:45-2:15pm. 06/05/2024: 5:50-6:35pm. 06/07/2024: 10:40-10:55am. 06/09/2024: 12:25-12:45pm (inputting chart review information into the evaluation). 06/15/2024: 10:30-11:10am. 06/21/2024: 6:15-6:35pm and 7:25-7:40pm.06/26/2024: 10:25-11am and 7:45-8:05pm. 06/27/2024: 3:30-3:55pm, 4:50-5:10pm, 9:25-9:50pm, and 10:10-15pm. 06/28/2024: 3:25-3:30pm.  (billing code 03866 [6 units])  Plan: Suzen provided verbal consent for her evaluation to be sent via e-mail. No further follow-up planned by this provider.        CONFIDENTIAL PSYCHOLOGICAL EVALUATION ______________________________________________________________________________ Name: Suzen Jerry Date of Birth: 11/26/1976    Age: 37 Dates of Evaluation: 06/02/2024, 06/14/2024, and 06/21/2024  Report Writing: 340 total minutes. 06/04/2024: 12:50-1:10pm and 1:45-2:15pm. 06/05/2024: 5:50-6:35pm. 06/07/2024: 10:40-10:55am. 06/09/2024: 12:25-12:45pm (inputting chart review information into the evaluation). 06/15/2024: 10:30-11:10am. 06/21/2024: 6:15-6:35pm and 7:25-7:40pm.06/26/2024:  10:25-11am and 7:45-8:05pm. 06/27/2024: 3:30-3:55pm, 4:50-5:10pm, 9:25-9:50pm, and 10:10-15pm. 06/28/2024: 3:25-3:30pm.   SOURCE AND REASON FOR REFERRAL: Ms. Helbling was referred by FNP Margart Arnett for an evaluation to ascertain if she meets the criteria for Attention Deficit/Hyperactivity Disorder (ADHD).   EVALUATIVE PROCEDURES: Clinical Interview with Ms. Talisha Erby (06/02/2024) Wechsler Adult Intelligence Scale-Fourth Edition (WAIS-5; 06/21/2024) CNS Vital Signs (06/21/2024) Adult Attention Deficit/Hyperactivity Disorder Self-Report Scale Checklist (06/21/2024) Behavior Rating Inventory for Executive Function - A - Self Report  Behavior Rating Inventory for Executive Function - 2A - Self Report (BRIEF-2A; 06/14/2024) and Informant (06/02/2024) Personality Assessment Inventory (PAI; 06/14/2024) Patient Health Questionnaire-9 (PHQ-9) Generalized Anxiety Disorder-7 (GAD-7)   BACKGROUND INFORMATION AND PRESENTING PROBLEM: Ms. Barney Gertsch is a 48 year old female who resides in [place] .  Ms. Kreher reported she is "positive [she has] some form of ADD" and that it has "just gotten progressively worse as monte has] gotten older" to the point it is "almost debilitating." She noted uncertainty about what is causing this exacerbation and denied being aware of any significant life changes that have occurred. She further reported she has been utilizing Prozac  and Wellbutrin , and that she has "been on [a psychotropic medication] for at least 20 years," and that while medication has been helpful for mood, various ADHD-related symptoms have persisted. She endorsed the following ADHD-related symptoms:  Symptoms Frequency   Often Occasionally Infrequent/No Significant Issues  Inattention Criterion (DSM-5-TR)     Making careless mistakes and missing small details.   She noted this symptom occasionally occurs.    Being easily distracted by various stimuli and the mind often being elsewhere even when no apparent  distractions exist.  She described being easily distracted by various stimuli (e.g., irrelevant tasks and thoughts), noting it occurs even when there are no obvious distractions and that it contributes to task maintenance issues.     Trouble sustaining attention during conversations.    She denied experiencing significant issues with this symptom.  Does not follow through on instructions and fails to finish tasks.  She endorsed commonly experiencing task initiation and maintenance issues.     Difficulty organizing tasks and activities.  She described regularly having clutter in her environment, task maintenance issues, prioritization problems, and trouble determining where to start on tasks and with planning.      Avoids, dislikes, or is reluctant to engage in tasks that require sustained mental effort. She stated she tends to put off tasks until last minute, especially if it is a "big task."    Loses things necessary for tasks or activities.    She denied experiencing significant issues with this symptom.  Forgetful in daily activities.    She denied experiencing significant issues with this symptom.   Hyperactivity and Impulsivity Criterion (DSM-5-TR)     Fidgets with hands or feet or squirms in seat. She described her body as consistently moving and fidgeting.     Leaves seat in situations in which remaining seated is expected.   She denied experiencing significant issues with this symptom.   Feelings of restlessness. She reported she often feels restless.     Difficulty playing or engaging in leisure activities quietly.   She denied experiencing significant issues with this symptom.  "On the go" or often acts as if "driven by a motor."   She denied experiencing significant issues with this symptom.  Talks excessively.  She shared that she is prone to "overexplaining" and providing excessive details while speaking. She also shared that her mind regularly "move[s] quick" and that she is trying to  keep up with it, which contributes to the aforementioned difficulty.     Interrupts others.  She stated her mind "going faster" leads to her interrupting others despite efforts to not interrupt others.     Impatience.    She denied experiencing significant issues with this symptom.  ADHD-Related Symptoms that Assist in Identifying ADHD but are not in the DSM-5-TR Jeanna, 2021, p. 6-12 and [phone])  Emotional dysregulation and overstimulation. She shared she is prone to feelings of overwhelm if experiencing multiple pulls on her attention.    Making decisions impulsively.   She denied experiencing significant issues with this symptom.  Tending to drive much faster than others.   She denied experiencing significant issues with this symptom.  Trouble following through on promises or commitments made to others.   She noted her completion of promises is occasionally "delayed" but that she tends to follow through with them.     Trouble doing things in proper order or sequence.   She denied experiencing significant issues with this symptom.   Ms. Calbert discussed a history of periods of depressed mood and use of an antidepressant; generalized anxiety that occurs less days than not, adding that she was "more anxious" when younger; social anxiety-related symptoms (e.g., concerns about how others perceive her that causes modest distress); emotional eating-related behaviors (e.g., eating when bored but not physically hungry); childhood neglect (e.g., receiving limited affection from her mother); and a period of suicidal ideation without plan or intent that she attributed to a psychotropic medication change. She expressed a belief that her ADHD-related symptoms are consistent across situations and independent of mood. However, she noted that she does not remember inattention being a significant issue as a child, but that other difficulties, such as disorganization and task initiation issues, have "always been  there."    Ms. Reine denied awareness of having ever experienced any developmental milestone delays, grade retention, learning disability diagnosis, or having an individualized education plan. She discussed that she received "Cs, Bs, and occasionally As" throughout primary and secondary schooling. She also discussed that she struggled with mathematics as she "d[id not] get it" throughout schooling. However, she indicated that she does better utilizing mathematics in real-world scenarios (e.g., at her workplace). She stated she "probably daydreamed" occasionally throughout schooling, but not to an extent that caused significant issues. Ms. Mills noted she completed a "few [college] classes," but ceased college as she "got bored" and had trouble determining what she "wanted to do." She further noted her college grades were "okay." She stated she has been employed with [employer] in various positions for 17 years, sharing that she tends to "stay with jobs for a while." She denied having a history of employment disciplinary actions.    Ms. Essie reported her medical history is significant for her being "overweight" and having "arthritis in [her] knee." She denied awareness of ever experiencing seizures, head injury, or sleep apnea-related symptoms. She reported that her mental health history is significant for the use of psychotropic medication. She also reported use of two 200mg  "caffeine pills" daily and alcohol use approximately three times a year. She disclosed ceasing tobacco use four or five years ago and denied use of all other recreational or illicit substances. She also denied ever utilizing psychotherapeutic services, being psychiatrically hospitalized, or having a history of legal involvement. Ms. Schouten further denied ever meeting the full criteria for hypomanic or manic episode; autism spectrum disorder; obsessions and compulsions; sleep-wake disorder; and schizophrenia spectrum and related disorder, as  well as having ever experienced homicidal ideation, plan, or intent. She stated her familial mental health history is significant for "PTSD," "depression," "binge eating," and possible ADHD (brother); "PTSD" (cousin); and ADHD (daughter).   Chart Review: Per an 08/10/2011 note, Dr. Delon Finder reported, "Ms. Killian has a history of ADD per her report. Her previous physician had tried her on Adderall, however she reports minimal improvement  on this. She denies any problems with elevated blood pressure, palpitations, agitation on the medication. She does note at her last visit her doctor increase both her dose of Adderall and her dose of Wellbutrin  in an effort to improve her concentration. She has been on Wellbutrin  for several years to help with anxiety and depression. She reports that she first developed depression after the birth of her first child. She feels that her depression is well controlled at this time."  Per a 09/16/2011 appointment note, Dr. Delon Finder reported, "[Ms. Spicher] reports that since starting 5 and she has noted some improvement in her symptoms of difficulty concentrating. Her symptoms tend to worsen throughout the day. She questions whether she may need a higher dose. At her last visit we reduced her dose of Wellbutrin  to 300 mg. Her previous primary care physician had her on 450 mg daily which is above the maximal dose. She reports that on 300 mg of Wellbutrin  daily her symptoms of anxiety and depression seem well-controlled."  Per a 09/13/2020 appointment note, FNP Rollene Northern reported Ms. Starkel's "depression and anxiety" had "fluctuated somewhat" and that her concentration problems had become exacerbated. Ms. Northern also reported that Ms. Steinhilber expressed a desire to increase her Zoloft  dosage instead of pursuing ADHD testing.   During a 12/18/2020 appointment, Ms. Arnett referred Ms. Lysbeth for an ADHD evaluation.   On 10/27/2023, Ms. Arnett and Ms. Chaisson communicated via  [employer]. During this exchange Ms. Castagnola stated, "at one time you had given me a referral for an ADHD screening" and she "wonder[ed] if that's something [she] should try again or if an increase in the Prozac  would be enough." Ms. Arnett responded, "I think it is quite reasonable to go ahead and proceed with referral for ADHD testing" and placed a referral for an ADHD evaluation.    BEHAVIORAL OBSERVATIONS: Ms. Oregel presented on time for the evaluation. She was well-groomed. She was oriented to the time, place, person, and purpose of the appointment. During the evaluation, Ms. Hildebrant fidgeted (e.g., bit near her fingernails and twirled her hair) and verbalized and/or demonstrated self-expression difficulties (e.g., noting she was experiencing word finding difficulties), working memory-related problems (e.g., stating she "was focusing too hard" which led to her missing verbally provided digits, indicating she had forgotten part of the instructions of a task, and expressing that she found the Working Memory Index subtests "horrible"), and long-term memory retrieval issues (e.g., reporting she "knew [a Vocabulary subtest] word" but could not recall it at the moment without it being given "in a sentence"). Throughout the evaluation, she maintained appropriate eye contact. Her thought processes and content were logical, coherent, and goal-directed. There were no overt signs of a thought disorder or perceptual disturbances, nor did she report such symptomatology. There was no evidence of paraphasias (i.e., errors in speech, gross mispronunciations, and word substitutions), repetition deficits, or disturbances in volume or prosody (i.e., rhythm and intonation). Overall, based on Ms. Josephs's approach to testing, the current results are believed to be a fair estimate of her abilities.  PROCEDURAL CONSIDERATIONS:  Psychological testing measures were conducted through a virtual visit with video and audio  capabilities, but otherwise in a standard manner.   The Wechsler Adult Intelligence Scale, Fifth Edition (WAIS-5) was administered via remote telepractice using digital stimulus materials on [employer]'s Q-global system. The remote testing environment appeared free of distractions, adequate rapport was established with the examinee via video/audio capabilities, and Ms. Hemrick appeared appropriately engaged in the task  throughout the session. No significant technological problems or distractions were noted during administration. Modifications to the standardization procedure included: none. The WAIS-5 subtests, or similar tasks, have received initial validation in several samples for remote telepractice and digital format administration, and the results are considered a valid description of Ms. Dicostanzo's skills and abilities.  CLINICAL FINDINGS:  COGNITIVE FUNCTIONING  Wechsler Adult Intelligence Scale, Fourth Edition (WAIS-5): Ms. Kronick completed subtests of the WAIS-5, a full-scale measure of cognitive ability. The WAIS-5 comprises four indices that measure cognitive processes that are components of intellectual ability; however, only subtests from the Verbal Comprehension and Working Memory indices were administered. As a result, Full-Scale-IQ (FSIQ) and General Ability Index (GAI) could not be determined.   WAIS-5 Scale/Subtest Composite Score/Scaled Score 95% Confidence Interval Percentile Rank Qualitative Description  Verbal Comprehension (VCI) 108 100-115 70 Average  Similarities 12     Vocabulary 11     Working Memory (WMI) 97 90-104 42 Average  Digit Sequencing  10     Running Digits 9     Digits Forward 10       The Verbal Comprehension Index (VCI) measures one's ability to receive, comprehend, and express language. It also measures the ability to retrieve previously learned information and to understand relationships between words and concepts presented orally. Ms. Urquilla obtained a VCI score  of 108 (70th percentile), placing her in the average range compared to same-aged peers. Her performance on the subtests comprising this index was similar, which suggests her verbal reasoning abilities are comparably developed.   The Working Memory Index (WMI) measures one's ability to sustain attention, concentrate, and exert mental control. Ms. Governale obtained a WMI score of 97 (42nd percentile), placing her in the average range compared to same-aged peers. She demonstrated similar performance on the subtests comprising this index. The 11-point difference between her VCI and WMI scores suggests her ability to sustain attention, concentrate, and exert mental control are a weakness relative to her verbal reasoning abilities.   ATTENTION AND PROCESSING  CNS Vital Signs: The CNS Vital Signs assessment evaluates the neurocognitive status of an individual and covers a range of mental processes. The results of the CNS Vital Signs testing indicated average neurocognitive processing ability. Attentional abilities ranged from average to low, with simple attention and complex attention in the average range, and sustained attention in the low range. However, her sustained attention score was deemed potentially invalid. Executive function and cognitive flexibility were average to above, but comparable. Working memory was average, although her score was deemed potentially invalid. Psychomotor speed, motor speed, and processing speed were average, which suggests average hand-eye coordination and thinking speed. Reaction time was above. Visual memory (images) was above, and verbal memory (words) was average, which indicates visual memory is a relative strength. The results suggest Ms. Standifer experiences impairment in sustained attention; weakness in psychomotor speed; and strengths in visual memory, reaction time, and executive function. However, her sustained attention score was deemed potentially invalid.   Domain   Standard Score Percentile Validity Indicator Guideline  Neurocognitive Index 107 68 Yes Average  Composite Memory 112 79 Yes Above  Verbal Memory 105 63 Yes Average  Visual Memory 113 81 Yes Above  Psychomotor Speed 89 23 Yes Low Average  Reaction Time 117 87 Yes Above  Complex Attention 108 70 Yes Average  Cognitive Flexibility 109 73 Yes Average  Processing Speed  90 25 Yes Average  Executive Function 110 75 Yes Above  Working Memory 99 47 No Average  Sustained Attention 70 2 No Low  Simple Attention 106 66 Yes Average  Motor Speed 92 30 Yes Average   EXECUTIVE FUNCTION Behavior Rating Inventory of Executive Function, Second Edition Event organiser) Self-Report:  Ms. Vicente completed the Self-Report Form of the Behavior Rating Inventory of Executive Function-Adult Version, Second Edition Adean-Clark), which has three domains that evaluate cognitive, behavioral, and emotional regulation, and a Global Executive Composite score provides an overall snapshot of executive functioning. There are no missing item responses in the protocol.  The Negativity, Infrequency, and Inconsistency scales are not elevated, suggesting she did not respond to the protocol in an overly negative, haphazard, extreme, or inconsistent manner. In the context of these validity considerations, the ratings of Ms. Cohron's everyday executive function suggest some areas of concern. The overall index score, the GEC, was within normal limits (GEC T = 64, %ile = 92). The Behavior Regulation Index (BRI) and Emotion Regulation Index (ERI) scores were within normal limits (BRI T = 54, %ile = 77; ERI T = 60, %ile = 82), but the Cognitive Regulation Index (CRI) score was mildly elevated (CRI T = 67, %ile = 95). Ms. Sweigert indicated difficulty with her ability to get going on tasks and activities and independently generate ideas and sustain working memory. She did not describe her ability to resist impulses, be aware of her functioning in social  settings, adjust well to changes, react to events appropriately, plan and organize her approach to problem solving appropriately, be appropriately cautious in her approach to tasks and check for mistakes, and keep materials and belongings reasonably well-organized. However, the Inhibit, Plan/Organize, and Organization scales approached an abnormal elevation.   Scale/Index  Raw Score T Score Percentile Qualitative Description  Inhibit 12 60 84 Approaching an Elevation  Self-Monitor 7 49 65 Within Normal Limits  Behavior Regulation Index (BRI) 19 54 77 Within Normal Limits  Shift 10 58 84 Within Normal Limits  Emotional Control 13 58 83 Within Normal Limits  Emotion Regulation Index (ERI) 23 60 82 Approaching an Elevation  Initiate 18 73 98 Moderately Elevated  Working Memory 15 67 93 Mildly Elevated  Plan/Organize 14 62 88 Approaching an Elevation  Task Monitor 10 58 86 Within Normal Limits  Organization of Materials 15 62 91 Within Normal Limits  Cognitive Regulation Index (CRI) 72 67 95 Mildly Elevated  Global Executive Composite (GEC) 114 64 92 Approaching an Elevation   Validity Scale Raw Score Cumulative Percentile Protocol Classification  Inconsistency 0 98 Acceptable  Negativity 0 98 Acceptable  Infrequency 0 98 Acceptable  Behavior Rating Inventory of Executive Function, Second Edition Kierstynn-Clark) Informant:  Ms. Wanek's spouse, Mr. Velinda Lysbeth, completed the Informant Form of the Behavior Rating Inventory of Executive Function-Adult Version, Second Edition (BRIEF-2A), which is equivalent to the Self-Report version and has three domains that evaluate cognitive, behavioral, and emotional regulation, and a Global Executive Composite score provides an overall snapshot of executive functioning. There are no missing item responses in the protocol. The Negativity, Infrequency, and Inconsistency scales are not elevated, suggesting he did not respond to the protocol in an overly negative,  haphazard, extreme, or inconsistent manner. In the context of these validity considerations, Mr. Percifield ratings of Ms. Rymer's everyday executive function suggest some areas of concern. The overall index score, the GEC, was moderately elevated (GEC T = 70, %ile = 97). The Behavior Regulation Index (BRI) score was within normal limits (BRI T = 48, %ile = 61), but the Emotion Regulation Index (ERI) score was  mildly elevated (ERI T = 68, %ile = 94) and the Cognitive Regulation Index (CRI) score was highly elevated (CRI T = 75, %ile = >99). Mr. Ivancic indicated Ms. Halvorsen experiences difficulty with her ability to react to events appropriately, get going on tasks and activities and independently generate ideas, plan and organize their approach to problem solving appropriately, and keep materials and belongings reasonably well-organized. He did not describe her ability to resist impulses, be aware of their functioning in social settings, adjust well to changes, sustain working memory, and be appropriately cautious in their approach to tasks and check for mistakes as problematic. However, the Working Memory and Task-Monitor scales approached an abnormal elevation. The elevated scores on the Plan/Organize, Organization of Materials, and Initiate suggest Ms. Tugman is perceived as having a disorganized approach to solving problems, likely resulting in her becoming overwhelmed with complex task demands. This may result in Ms. Madry shutting down and demonstrating poor task initiation.   Scale/Index  Raw Score T Score Percentile Qualitative Description  Inhibit 9 46 52 Within Normal Limits  Self-Monitor 8 51 70 Within Normal Limits  Behavior Regulation Index (BRI) 17 48 61 Within Normal Limits  Shift 9 54 78 Within Normal Limits  Emotional Control 20 73 96 Moderately Elevated  Emotion Regulation Index (ERI) 29 68 94 Mildly Elevated  Initiate 20 80 >99 Highly Elevated  Working Memory 13 61 90 Approaching an Elevation   Plan/Organize 17 71 99 Moderately Elevated  Task-Monitor 11 61 96 Approaching an Elevation   Organization of Materials 20 72 97 Moderately Elevated  Cognitive Regulation Index (CRI) 81 75 >99 Highly Elevated  Global Executive Composite (GEC) 127 70 97 Moderately Elevated   Validity Scale Raw Score Cumulative Percentile Protocol Classification  Inconsistency 2 99 Acceptable  Negativity 1 98 Acceptable  Infrequency 0 98 Acceptable   BEHAVIORAL FUNCTIONING   Patient Health Questionnaire-9 (PHQ-9): Ms. Byland completed the PHQ-9, a self-report measure that assesses symptoms of depression. She scored 6/27, which indicates mild depression.   Generalized Anxiety Disorder-7 (GAD-7): Ms. Tutor completed the GAD-7, a self-report measure that assesses symptoms of anxiety. She scored 4/21, which indicates minimal anxiety.   Adult ADHD Self-Report Scale Symptom Checklist (ASRS): Ms. Waskey reported the following symptoms as sometimes: difficulty wrapping up the final details of a project following the completion of challenging aspects, problems remembering appointments or obligations, fidgeting or squirming, making careless mistakes when working on boring or complex projects, being distracted by noise around her, trouble concentrating on what people are saying even when they are speaking directly to her, feeling restless or fidgety, talking too much in social situations, interrupting others or finishing their sentences, and difficulty waiting her turn when turn taking is required, and interrupting others when they are busy. She endorsed the following symptoms as occurring often: difficulty getting things in order when a task requires organization, avoiding or delaying getting started on tasks requiring a lot of thought, and struggling to sustain attention when doing boring or repetitive work. She did not endorse any symptoms as very often. The endorsement of at least four items in Part A is highly consistent  with ADHD in adults. The frequency scores of Part B provide additional cues. Ms. Hult scored a 4/6 on Part A and 4/12 on Part B, which is considered a positive screening for ADHD.   Personality Assessment Inventory (PAI): The PAI is an objective inventory of adult personality. The validity indicators suggested Ms. Ahuja responded consistently to similar item content (  ICN T = 46), and did not appear to portray herself in a highly negative (NIM T = 44) or unrealistically favorable (PIM T = 48) manner. However, she endorsed some idiosyncratic responses (INF T = 67). She largely denied experiencing clinically significant issues or concerns, although she endorsed some thoughts of inadequacy and concentration issues (DEP-C T = 61). She reports close, generally supportive relationships with friends and family (NON T = 39), although may seek to relinquish control in relationships and prefer to approach them in a more passive manner (DOM T = 36). She appears to acknowledge the need to make some changes, has a positive attitude toward the possibility of personal change, and accepts the importance of individual responsibility (RXR T = 46).   SUMMARY AND CLINICAL IMPRESSIONS: Ms. Haly Feher is a 48 year old female who was referred by FNP Margart Arnett for an evaluation to determine if she currently meets criteria for a diagnosis of Attention-Deficit/Hyperactivity Disorder (ADHD).   Ms. Derstine reported she is "positive [she has] some form of ADD" and that it has "just gotten progressively worse as monte has] gotten older" to the point it is "almost debilitating." She further reported she has been utilizing Prozac  and Wellbutrin , which have been helpful for mood, but various ADHD-related symptoms have persisted. She expressed a belief that her ADHD-related symptoms are consistent across situations and independent of mood. However, she noted that she does not remember inattention being a significant issue as a child,  but that other difficulties, such as disorganization and task initiation issues, have "always been there."    Ms. Schlagel was administered assessments during the evaluation to measure her current cognitive abilities. Her verbal comprehension abilities were in the average range, and comparably developed. Her ability to sustain attention, concentrate, and exert mental control was also in the average range, although 11-points lower than her VCI score, which suggests they are a weakness relative to her verbal reasoning abilities. Results of the CNS Vital Signs indicated an average neurocognitive processing ability, although she demonstrated impairment in sustained attention; weakness in psychomotor speed; and strengths in visual memory, reaction time, and executive function. However, her sustained attention score was deemed potentially invalid  During the clinical interview and on self-report measures, Ms. Massiah endorsed executive functioning concerns that include attentional dysregulation, hyperactivity- and impulsivity-related symptoms, and nearly meeting the full criteria for ADHD. Her spouse, Mr. Velinda Jerry, indicated she is experiencing several significant executive function issues. While Ms. Veverly is endorsing multiple ADHD-related symptoms that she described as consistent, impairing, and persisting despite use of psychotropic medication that has been helpful for mood, her endorsed symptoms are just under the DSM-5 threshold required for meeting full criteria for ADHD. Moreover, she indicated ADHD-related symptoms she experienced in childhood and earlier adulthood were seemingly less impairing than her current symptoms, but she was uncertain of what may be contributing to this exacerbation. Mr. Broerman indicated perceiving her as experiencing some executive functioning issues, but not to an extent that would be anticipated if full criteria for ADHD was being met (e.g., his results suggest he does not perceive her  as experiencing significant issues with resisting impulses, monitoring her social behaviors, or with sustaining her attention). As such, a diagnosis of F90.8 Other Specified Attention-Deficit/Hyperactivity Disorder, with insufficient inattention and hyperactivity and impulsivity symptoms appears warranted.   Ms. Cadden also endorsed periods of depressed mood and use of an antidepressant; generalized anxiety that occurs less days than not; social anxiety-related symptoms; and emotional eating-related behaviors. As such,  the PHQ-9, GAD-7, and PAI were administered. Her results suggested she experiences mild depression-related symptoms and minimal anxiety-related symptoms. However, given the limited scope of this evaluation, it was unable to be determined if full criteria for a depressive disorder or anxiety disorder(s) are met, although chart review suggests she does meet full criteria when not utilizing psychotropic medication, or if her diagnosis of Other Specified Attention-Deficit/Hyperactivity Disorder better explain the symptoms. Thus, she would likely benefit from ongoing evaluation of these symptoms.  Should any of the aforementioned be ruled in, they would likely be in addition to her diagnosis of Other Specified Attention-Deficit/Hyperactivity Disorder, as she described her ADHD-related concerns as occurring prior to some of the concerns, as consistent across situations, and independent of mood.  DSM-5 Diagnostic Impressions: F90.8 Other Specified Attention-Deficit/Hyperactivity Disorder, with insufficient inattention and hyperactivity and impulsivity symptoms appears warranted.   RECOMMENDATIONS: Ms. Messineo may benefit from a consultation regarding medication for ADHD-related symptoms.   Individual therapeutic services may assist in further evaluation and managing of her other endorsed mental health concerns as well as discussing coping and compensatory strategies. Ms. Baksh would likely benefit  from making use of strategies for ADHD symptoms:  Setting a timer to complete tasks. Break tasks into manageable chunks and spread them out over more extended periods with breaks.  Utilizing lists and day calendars to keep track of tasks.  Answering emails daily.  Improve listening skills by asking the speaker to give information in smaller chunks and asking for explanations and clarification as needed. Leaving more than the anticipated time to complete tasks. It may help to keep tasks brief, well within your attention span, and a mix of both high and low-interest tasks. Tasks may be gradually increased in length. Practice proactive planning by setting aside time every evening to plan for the next day (e.g., prepare needed materials or pack the car the night before).  Learn how to make a practical and reasonable "to-do" list of important tasks and priorities and always keep it easily accessible. Make additional copies in case it is lost or misplaced. Utilize visual reminders by posting appointments, "to-do lists," or schedules in strategic areas at home and work.  Practice using an appointment book, smartphone, or other tech device, or a daily planning calendar, and learn to write down appointments and commitments immediately. Keep notepads or use a portable audio recorder to capture important ideas that would be beneficial to recall later. Learn and practice time management skills. Purchase a programmable alarm watch or set an alarm on a smartphone to avoid losing track of time.  Use a color-coded file system, desk and closet organizers, storage boxes, or other organization devices to reduce clutter and improve efficiency and structure.  Implement ways to become more aware of your actions and to inhibit or adjust them as warranted (e.g., reviewing videos of your actions, considering consequences of obeying or not obeying the rules of various upcoming situations, having a trusted other to discuss  plans with, and/or provide cues to stop certain behaviors, and make visual cues for rules you would like to follow). The 4Rs: Read just one paragraph, recite out loud in a soft voice or whisper what was important in that material, write that material down in a notebook, then review what you just wrote. Stay flexible and be prepared to change your plans, as symptom breakthroughs and crises will likely occur periodically. Ms. Yager may benefit from mindfulness training to address symptoms of inattention.  Mental alertness/energy can be raised  by increasing exercise; improving sleep; eating a healthy diet; and managing stress. Consulting with a physician regarding any changes to the physical regimen is recommended. "Failing at Normal: An ADHD Success Story" by Harlene Solar is a great overview of ADHD. Dr. Nelwyn Pica also has a YouTube channel called, "Nelwyn Pica, PhD - Dedicated to ADHD Science+" with helpful videos on ADHD-related topics: [email or web address]  Applications:  RescueTime. Tracks your activities on your phone and/or computer to determine how productive you have been and what distracted you. Free two-week trial.  [email or web address] . It uses engineered audio that may reduce distractions and assist with focus. Free 15-day trial. Freedom. Allows you to highlight days and times you want to block yourself from certain sites or apps. Free trial. Merrily.  It allows you to input your bank accounts and creates a visual layout of information about your financial goals, budget management, alerts, etc. May offer a free trial. Boomerang. It allows you to schedule times an email is sent and to see if others have received or opened your email. Ten messages free per month and a free trial of the premium version. IFTTT. Uses "channels" to create various actions (e.g., if you are mentioned in an email, highlight it in your inbox, and if you miss a call, add it to a to-do list).  Free and premium versions. Unroll.me. Cleans up your email by unsubscribing from what you do not want to receive while still getting everything you do. Free. Finish. It allows you to divide two-list tasks into short-term, mid-term, and long-term tasks and determine how much time is left for a task. Focus mode hides non-priority tasks.  Autosilent. Turns your phone ringer on and off based on specified calendars, geo-fences, timers, etc. $3.99. Freakyalarm. It makes you solve math problems to disable an alarm. $1.99. Wake N Shake. It makes you vigorously shake your phone to stop the alarm. $.99. Todoist. It allows you to add sub-tasks to tasks and includes email and web plugins to make it work across the system. Premium has location-based reminders, calendar sync, productive tracking, etc.  Sleep Cycle. Utilize your phone's motion sensors to catch movement while you are asleep. The alarm will wake you as early as 30 minutes before your alarm based on your lightest sleep phase and show you how daily activities affect your sleep quality.  Books: "Taking Charge of Adult ADHD Second Edition" by Dr. Nelwyn Pica "The ADHD Effect on Marriage" by Eleanor Bowers "The Couples Guide to Thriving with ADHD" by Eleanor Bowers Organizations that are a reliable source of information on ADHD:  Children and Adults with Attention-Deficit/Hyperactivity Disorder (CHADD): chadd.org  Attention Deficit Disorder Association (ADDA): HotterNames.de ADD Resources: addresources.org ADD WareHouse: addwarehouse.com World Federation of ADHD: adhd-federation.org ADDConsults: FightListings.se. Compilation of ADHD resources: https://www.harrell.com/ Future evaluation, if deemed necessary, and/or to determine the effectiveness of recommended interventions.   Frederic Fire, Psy.D. Licensed Psychologist - [facility] [phone]   References  Pica, R. A. (2021). Taking charge of adult ADHD: proven strategies to  succeed at work, at   home, and in relationships (pp. 6-10 and 272-276). [employer].             Frederic ONEIDA Fire, PsyD

## 2024-07-10 DIAGNOSIS — M1712 Unilateral primary osteoarthritis, left knee: Secondary | ICD-10-CM | POA: Diagnosis not present

## 2024-08-04 ENCOUNTER — Other Ambulatory Visit: Payer: Self-pay | Admitting: Family

## 2024-08-04 ENCOUNTER — Other Ambulatory Visit: Payer: Self-pay | Admitting: Internal Medicine

## 2024-08-04 DIAGNOSIS — E66811 Obesity, class 1: Secondary | ICD-10-CM

## 2024-08-04 DIAGNOSIS — F32A Depression, unspecified: Secondary | ICD-10-CM

## 2024-09-06 ENCOUNTER — Other Ambulatory Visit: Payer: Self-pay | Admitting: Family

## 2024-09-06 DIAGNOSIS — F32A Depression, unspecified: Secondary | ICD-10-CM

## 2024-10-24 ENCOUNTER — Other Ambulatory Visit: Payer: Self-pay | Admitting: Medical Genetics

## 2024-10-24 DIAGNOSIS — Z006 Encounter for examination for normal comparison and control in clinical research program: Secondary | ICD-10-CM

## 2024-11-05 ENCOUNTER — Other Ambulatory Visit: Payer: Self-pay | Admitting: Family

## 2024-11-05 DIAGNOSIS — E66811 Obesity, class 1: Secondary | ICD-10-CM

## 2024-11-09 ENCOUNTER — Other Ambulatory Visit: Payer: Self-pay | Admitting: Family

## 2024-11-09 DIAGNOSIS — F32A Depression, unspecified: Secondary | ICD-10-CM

## 2024-11-24 DIAGNOSIS — M1712 Unilateral primary osteoarthritis, left knee: Secondary | ICD-10-CM | POA: Diagnosis not present

## 2024-11-24 DIAGNOSIS — M170 Bilateral primary osteoarthritis of knee: Secondary | ICD-10-CM | POA: Diagnosis not present

## 2024-11-27 ENCOUNTER — Other Ambulatory Visit: Payer: Self-pay | Admitting: Family

## 2024-11-27 DIAGNOSIS — E66811 Obesity, class 1: Secondary | ICD-10-CM

## 2024-11-27 MED ORDER — METFORMIN HCL ER 500 MG PO TB24: ORAL_TABLET | ORAL | 0 refills | Status: DC

## 2024-11-28 ENCOUNTER — Ambulatory Visit: Admitting: Family

## 2024-11-30 ENCOUNTER — Ambulatory Visit: Admitting: Family

## 2024-12-06 ENCOUNTER — Telehealth: Payer: Self-pay | Admitting: Family

## 2024-12-06 NOTE — Telephone Encounter (Signed)
 Patient due for follow-up.  Believes appointment was rescheduled due to weather. Please sch in new year

## 2024-12-06 NOTE — Telephone Encounter (Signed)
 Spoke to pt scheduled her for 12/28/24

## 2024-12-07 ENCOUNTER — Other Ambulatory Visit: Payer: Self-pay | Admitting: Family

## 2024-12-07 DIAGNOSIS — F419 Anxiety disorder, unspecified: Secondary | ICD-10-CM

## 2024-12-21 ENCOUNTER — Other Ambulatory Visit: Payer: Self-pay | Admitting: Family

## 2024-12-21 DIAGNOSIS — E66811 Obesity, class 1: Secondary | ICD-10-CM

## 2024-12-28 ENCOUNTER — Ambulatory Visit: Payer: Self-pay | Admitting: Family

## 2024-12-28 ENCOUNTER — Other Ambulatory Visit: Payer: Self-pay | Admitting: Family

## 2024-12-28 VITALS — BP 132/80 | HR 98 | Temp 98.2°F | Ht 63.0 in | Wt 246.0 lb

## 2024-12-28 DIAGNOSIS — E66813 Obesity, class 3: Secondary | ICD-10-CM

## 2024-12-28 DIAGNOSIS — Z13220 Encounter for screening for lipoid disorders: Secondary | ICD-10-CM

## 2024-12-28 DIAGNOSIS — Z6841 Body Mass Index (BMI) 40.0 and over, adult: Secondary | ICD-10-CM

## 2024-12-28 DIAGNOSIS — E663 Overweight: Secondary | ICD-10-CM | POA: Diagnosis not present

## 2024-12-28 DIAGNOSIS — F419 Anxiety disorder, unspecified: Secondary | ICD-10-CM | POA: Diagnosis not present

## 2024-12-28 DIAGNOSIS — N6001 Solitary cyst of right breast: Secondary | ICD-10-CM

## 2024-12-28 DIAGNOSIS — E559 Vitamin D deficiency, unspecified: Secondary | ICD-10-CM

## 2024-12-28 DIAGNOSIS — Z136 Encounter for screening for cardiovascular disorders: Secondary | ICD-10-CM | POA: Diagnosis not present

## 2024-12-28 DIAGNOSIS — F909 Attention-deficit hyperactivity disorder, unspecified type: Secondary | ICD-10-CM

## 2024-12-28 DIAGNOSIS — F32A Depression, unspecified: Secondary | ICD-10-CM | POA: Diagnosis not present

## 2024-12-28 DIAGNOSIS — Z1231 Encounter for screening mammogram for malignant neoplasm of breast: Secondary | ICD-10-CM

## 2024-12-28 LAB — COMPREHENSIVE METABOLIC PANEL WITH GFR
ALT: 16 U/L (ref 3–35)
AST: 12 U/L (ref 5–37)
Albumin: 4.1 g/dL (ref 3.5–5.2)
Alkaline Phosphatase: 58 U/L (ref 39–117)
BUN: 16 mg/dL (ref 6–23)
CO2: 26 meq/L (ref 19–32)
Calcium: 8.9 mg/dL (ref 8.4–10.5)
Chloride: 103 meq/L (ref 96–112)
Creatinine, Ser: 0.44 mg/dL (ref 0.40–1.20)
GFR: 115.24 mL/min
Glucose, Bld: 88 mg/dL (ref 70–99)
Potassium: 4.4 meq/L (ref 3.5–5.1)
Sodium: 135 meq/L (ref 135–145)
Total Bilirubin: 0.2 mg/dL (ref 0.2–1.2)
Total Protein: 6.9 g/dL (ref 6.0–8.3)

## 2024-12-28 LAB — LIPID PANEL
Cholesterol: 157 mg/dL (ref 28–200)
HDL: 74.3 mg/dL
LDL Cholesterol: 69 mg/dL (ref 10–99)
NonHDL: 82.75
Total CHOL/HDL Ratio: 2
Triglycerides: 71 mg/dL (ref 10.0–149.0)
VLDL: 14.2 mg/dL (ref 0.0–40.0)

## 2024-12-28 LAB — CBC WITH DIFFERENTIAL/PLATELET
Basophils Absolute: 0 K/uL (ref 0.0–0.1)
Basophils Relative: 0.5 % (ref 0.0–3.0)
Eosinophils Absolute: 0.3 K/uL (ref 0.0–0.7)
Eosinophils Relative: 4.3 % (ref 0.0–5.0)
HCT: 35.7 % — ABNORMAL LOW (ref 36.0–46.0)
Hemoglobin: 11.6 g/dL — ABNORMAL LOW (ref 12.0–15.0)
Lymphocytes Relative: 34 % (ref 12.0–46.0)
Lymphs Abs: 2.1 K/uL (ref 0.7–4.0)
MCHC: 32.6 g/dL (ref 30.0–36.0)
MCV: 85.9 fl (ref 78.0–100.0)
Monocytes Absolute: 0.5 K/uL (ref 0.1–1.0)
Monocytes Relative: 7.3 % (ref 3.0–12.0)
Neutro Abs: 3.3 K/uL (ref 1.4–7.7)
Neutrophils Relative %: 53.9 % (ref 43.0–77.0)
Platelets: 415 K/uL — ABNORMAL HIGH (ref 150.0–400.0)
RBC: 4.15 Mil/uL (ref 3.87–5.11)
RDW: 14.8 % (ref 11.5–15.5)
WBC: 6.2 K/uL (ref 4.0–10.5)

## 2024-12-28 LAB — VITAMIN D 25 HYDROXY (VIT D DEFICIENCY, FRACTURES): VITD: 25.96 ng/mL — ABNORMAL LOW (ref 30.00–100.00)

## 2024-12-28 LAB — TSH: TSH: 1.83 u[IU]/mL (ref 0.35–5.50)

## 2024-12-28 MED ORDER — BUPROPION HCL ER (XL) 300 MG PO TB24: 300.0000 mg | ORAL_TABLET | Freq: Every day | ORAL | 3 refills | Status: AC

## 2024-12-28 MED ORDER — FLUOXETINE HCL 20 MG PO CAPS: ORAL_CAPSULE | ORAL | 3 refills | Status: AC

## 2024-12-28 MED ORDER — WEGOVY 0.25 MG/0.5ML ~~LOC~~ SOAJ: 0.2500 mg | SUBCUTANEOUS | 2 refills | Status: AC

## 2024-12-28 NOTE — Progress Notes (Signed)
 "  Assessment & Plan:  Class 3 severe obesity with serious comorbidity and body mass index (BMI) of 45.0 to 49.9 in adult, unspecified obesity type [facility]) Assessment & Plan: Previously she has done quite well on Ozempic .  Stopped due to insurance coverage.  Trial of Wegovy .  Counseled on blackbox warning, side effects, mechanism of action.  Orders: -     Wegovy ; Inject 0.25 mg into the skin once a week.  Dispense: 2 mL; Refill: 2  Overweight -     CBC with Differential/Platelet -     Comprehensive metabolic panel with GFR -     TSH  Encounter for lipid screening for cardiovascular disease -     Lipid panel  Vitamin D  deficiency -     VITAMIN D  25 Hydroxy (Vit-D Deficiency, Fractures)  Encounter for screening mammogram for malignant neoplasm of breast -     3D Screening Mammogram, Left and Right; Future -     US  LIMITED ULTRASOUND INCLUDING AXILLA RIGHT BREAST; Future  Attention deficit disorder with hyperactivity Assessment & Plan: She was not formally diagnosed 06/2024 Frederic Fire.  Pending second opinion with [place] behavioral specialist  Orders: -     Ambulatory referral to Psychiatry  Anxiety and depression Assessment & Plan: Chronic, stable.  Continue Wellbutrin  300 mg daily ,Prozac  20mg     Orders: -     FLUoxetine  HCl; TAKE 1 CAPSULE BY MOUTH EVERY DAY IN THE MORNING  Dispense: 90 capsule; Refill: 3  Depression, unspecified depression type -     buPROPion  HCl ER (XL); Take 1 tablet (300 mg total) by mouth daily.  Dispense: 90 tablet; Refill: 3     Return precautions given.   Risks, benefits, and alternatives of the medications and treatment plan prescribed today were discussed, and patient expressed understanding.   Education regarding symptom management and diagnosis given to patient on AVS either electronically or printed.  Return in about 6 months (around 06/27/2025).  Rollene Northern, FNP  Subjective:    Patient ID: Taylor Wagner, female     DOB: Feb 10, 1976, 49 y.o.   MRN: [id number]  CC: Taylor Wagner is a 49 y.o. female who presents today for follow up.   HPI: HPI Discussed the use of AI scribe software for clinical note transcription with the patient, who gave verbal consent to proceed.  History of Present Illness   Taylor Wagner is a 49 year old female who presents for a follow-up regarding weight management and concentration deficit  She previously tried Zepbound  but experienced severe side effects, including vomiting, nausea.   She has a history of using Ozempic , which she tolerated well but had to discontinue due to insurance coverage issues as it was not covered for non-diabetic use.   She is currently taking metformin  2000mg  day. She notes a perceived benefit from the medication, as she can tell a difference when she is not taking it.  She has been evaluated for ADHD and was told she is 'right on the edge' of a diagnosis, which has been frustrating for her. She has not been on medication for ADHD for many years. She is currently taking Wellbutrin  and Prozac , which she feels are beneficial.   She reports issues with concentration, particularly when overwhelmed, and is concerned about her ability to manage tasks when her work responsibilities decrease.  She works as a financial planner, noting that her current role requires more responsibility and has improved her productivity. However, she is concerned  about her motivation and concentration when she returns to her previous role with less responsibility.   She is sleeping well.    Mammogram and left breast ultrasound overdue from 05/2022 Starting GLP agonist / GIP/GLP:   Denies family history medullary thyroid cancer, multiple endocrine neoplasia.  Denies personal history of pancreatitis, multiple endocrine neoplasia, chronic constipation. Eye exam is up-to-date.  No known history of retinopathy    Allergies: Bovine (beef)  protein-containing drug products, Erythromycin, and Porcine (pork) protein-containing drug products Medications Ordered Prior to Encounter[1]  Review of Systems  Constitutional:  Negative for chills and fever.  Respiratory:  Negative for cough.   Cardiovascular:  Negative for chest pain and palpitations.  Gastrointestinal:  Negative for nausea and vomiting.      Objective:    BP 132/80   Pulse 98   Temp 98.2 F (36.8 C) (Oral)   Ht 5' 3 (1.6 m)   Wt 246 lb (111.6 kg)   SpO2 99%   BMI 43.58 kg/m  BP Readings from Last 3 Encounters:  12/28/24 132/80  10/21/23 (!) 136/96  09/16/23 136/88   Wt Readings from Last 3 Encounters:  12/28/24 246 lb (111.6 kg)  10/21/23 230 lb 9.6 oz (104.6 kg)  09/16/23 237 lb (107.5 kg)    Physical Exam Vitals reviewed.  Constitutional:      Appearance: She is well-developed.  Eyes:     Conjunctiva/sclera: Conjunctivae normal.  Cardiovascular:     Rate and Rhythm: Normal rate and regular rhythm.     Pulses: Normal pulses.     Heart sounds: Normal heart sounds.  Pulmonary:     Effort: Pulmonary effort is normal.     Breath sounds: Normal breath sounds. No wheezing, rhonchi or rales.  Skin:    General: Skin is warm and dry.  Neurological:     Mental Status: She is alert.  Psychiatric:        Speech: Speech normal.        Behavior: Behavior normal.        Thought Content: Thought content normal.           [1]  Current Outpatient Medications on File Prior to Visit  Medication Sig Dispense Refill   EPINEPHrine  0.3 mg/0.3 mL IJ SOAJ injection Inject 0.3 mLs (0.3 mg total) into the muscle as needed for anaphylaxis. 1 each 2   metFORMIN  (GLUCOPHAGE -XR) 500 MG 24 hr tablet TAKE 4 TABLETS BY MOUTH IN THE EVENING 360 tablet 1   CALCIUM-MAGNESIUM PO Take by mouth.     No current facility-administered medications on file prior to visit.   "

## 2024-12-28 NOTE — Patient Instructions (Addendum)
 Please call  and schedule your 3D mammogram and /or bone density scan as we discussed.   [facility]  ( new location in 2023)  [address], [place], [place] [address]  [place], [place]  [phone]   We have discussed starting non insulin daily injectable medication called Wegovy   which is a glucagon like peptide (GLP 1) agonist and works by delaying gastric emptying and increasing insulin secretion.It is given once per week. Most patients see significant weight loss with this drug class.   You may NOT take either medication if you or your family has history of thyroid , parathyroid, OR adrenal cancer. Please confirm you and your family does NOT have this history as this drug class has black box warning on this medication for that reason.   Please follow  directions on prescription and slowly increase from 0.25mg  [place] once per week ;stay here for 4 weeks. You may then increase to 0.5mg  [place] once per week and stay there for 4 weeks.  We can slowly titrate further at follow up with goal of no more than 1-2 lbs weight loss per week.  Semaglutide  (Wegovy )  Dose (mg) Once Weekly Titration:    If a dose is not tolerated, consider delaying further dose increases for  another 4 weeks.  If you are actively losing weight on a dose, do not increase medication.   Weeks 1 through 4  0.25 mg once weekly  Weeks 5 through 8  0.5 mg once weekly  Weeks 9 though 12  1 mg once weekly  Weeks 13 through 16  1.7 mg once weekly   Semaglutide  Injection (Weight Management) What is this medication? SEMAGLUTIDE  (SEM a GLOO tide) promotes weight loss. It may also be used to maintain weight loss. It works by decreasing appetite. Changes to diet and exercise are often combined with this medication. This medicine may be used for other purposes; ask your health care provider or pharmacist if you have questions. COMMON BRAND NAME(S): Wegovy  What should I tell my care team before I take  this medication? They need to know if you have any of these conditions: Endocrine tumors (MEN 2) or if someone in your family had these tumors Eye disease, vision problems Gallbladder disease History of depression or mental health disease History of pancreatitis Kidney disease Stomach or intestine problems Suicidal thoughts, plans, or attempt; a previous suicide attempt by you or a family member Thyroid  cancer or if someone in your family had thyroid  cancer An unusual or allergic reaction to semaglutide , other medications, foods, dyes, or preservatives Pregnant or trying to get pregnant Breast-feeding How should I use this medication? This medication is injected under the skin. You will be taught how to prepare and give it. Take it as directed on the prescription label. It is given once every week (every 7 days). Keep taking it unless your care team tells you to stop. It is important that you put your used needles and pens in a special sharps container. Do not put them in a trash can. If you do not have a sharps container, call your pharmacist or care team to get one. A special MedGuide will be given to you by the pharmacist with each prescription and refill. Be sure to read this information carefully each time. This medication comes with INSTRUCTIONS FOR USE. Ask your pharmacist for directions on how to use this medication. Read the information carefully. Talk to your pharmacist or care team if you have questions. Talk  to your care team about the use of this medication in children. While it may be prescribed for children as young as 12 years for selected conditions, precautions do apply. Overdosage: If you think you have taken too much of this medicine contact a poison control center or emergency room at once. NOTE: This medicine is only for you. Do not share this medicine with others. What if I miss a dose? If you miss a dose and the next scheduled dose is more than 2 days away, take the  missed dose as soon as possible. If you miss a dose and the next scheduled dose is less than 2 days away, do not take the missed dose. Take the next dose at your regular time. Do not take double or extra doses. If you miss your dose for 2 weeks or more, take the next dose at your regular time or call your care team to talk about how to restart this medication. What may interact with this medication? Insulin and other medications for diabetes This list may not describe all possible interactions. Give your health care provider a list of all the medicines, herbs, non-prescription drugs, or dietary supplements you use. Also tell them if you smoke, drink alcohol, or use illegal drugs. Some items may interact with your medicine. What should I watch for while using this medication? Visit your care team for regular checks on your progress. It may be some time before you see the benefit from this medication. Drink plenty of fluids while taking this medication. Check with your care team if you have severe diarrhea, nausea, and vomiting, or if you sweat a lot. The loss of too much body fluid may make it dangerous for you to take this medication. This medication may affect blood sugar levels. Ask your care team if changes in diet or medications are needed if you have diabetes. If you or your family notice any changes in your behavior, such as new or worsening depression, thoughts of harming yourself, anxiety, other unusual or disturbing thoughts, or memory loss, call your care team right away. Women should inform their care team if they wish to become pregnant or think they might be pregnant. Losing weight while pregnant is not advised and may cause harm to the unborn child. Talk to your care team for more information. What side effects may I notice from receiving this medication? Side effects that you should report to your care team as soon as possible: Allergic reactions--skin rash, itching, hives, swelling of the  face, lips, tongue, or throat Change in vision Dehydration--increased thirst, dry mouth, feeling faint or lightheaded, headache, dark yellow or brown urine Gallbladder problems--severe stomach pain, nausea, vomiting, fever Heart palpitations--rapid, pounding, or irregular heartbeat Kidney injury--decrease in the amount of urine, swelling of the ankles, hands, or feet Pancreatitis--severe stomach pain that spreads to your back or gets worse after eating or when touched, fever, nausea, vomiting Thoughts of suicide or self-harm, worsening mood, feelings of depression Thyroid  cancer--new mass or lump in the neck, pain or trouble swallowing, trouble breathing, hoarseness Side effects that usually do not require medical attention (report to your care team if they continue or are bothersome): Diarrhea Loss of appetite Nausea Stomach pain Vomiting This list may not describe all possible side effects. Call your doctor for medical advice about side effects. You may report side effects to FDA at 1-800-FDA-1088. Where should I keep my medication? Keep out of the reach of children and pets. Refrigeration (preferred): Store in the  refrigerator. Do not freeze. Keep this medication in the original container until you are ready to take it. Get rid of any unused medication after the expiration date. Room temperature: If needed, prior to cap removal, the pen can be stored at room temperature for up to 28 days. Protect from light. If it is stored at room temperature, get rid of any unused medication after 28 days or after it expires, whichever is first. It is important to get rid of the medication as soon as you no longer need it or it is expired. You can do this in two ways: Take the medication to a medication take-back program. Check with your pharmacy or law enforcement to find a location. If you cannot return the medication, follow the directions in the MedGuide. NOTE: This sheet is a summary. It may not  cover all possible information. If you have questions about this medicine, talk to your doctor, pharmacist, or health care provider.  2023 Elsevier/Gold Standard (2021-02-20 00:00:00)

## 2024-12-28 NOTE — Assessment & Plan Note (Signed)
 Chronic, stable.  Continue Wellbutrin  300 mg daily ,Prozac  20mg 

## 2024-12-28 NOTE — Assessment & Plan Note (Signed)
 Previously she has done quite well on Ozempic .  Stopped due to insurance coverage.  Trial of Wegovy .  Counseled on blackbox warning, side effects, mechanism of action.

## 2024-12-28 NOTE — Assessment & Plan Note (Signed)
 She was not formally diagnosed 06/2024 [facility].  Pending second opinion with [facility]

## 2025-01-05 ENCOUNTER — Ambulatory Visit: Payer: Self-pay | Admitting: Family

## 2025-01-05 DIAGNOSIS — D649 Anemia, unspecified: Secondary | ICD-10-CM

## 2025-01-05 DIAGNOSIS — E559 Vitamin D deficiency, unspecified: Secondary | ICD-10-CM

## 2025-01-05 MED ORDER — CHOLECALCIFEROL 1.25 MG (50000 UT) PO TABS: ORAL_TABLET | ORAL | 0 refills | Status: AC

## 2025-01-08 ENCOUNTER — Telehealth: Payer: Self-pay

## 2025-01-08 ENCOUNTER — Other Ambulatory Visit (HOSPITAL_COMMUNITY): Payer: Self-pay

## 2025-01-08 NOTE — Telephone Encounter (Signed)
 Mychart message sent to pt with info to enroll in calibrate program though her employeers group health plan.

## 2025-01-08 NOTE — Telephone Encounter (Signed)
 Pharmacy Patient Advocate Encounter  Received notification from [employer] that Prior Authorization for Wegovy  0.25MG /0.5ML auto-injectors has been APPROVED from 01/08/25 to 01/08/26   PA #/Case ID/Reference #: [id number]   *Enrollment in the [employer] program with a prescription written by a [employer] prescriber is required for this medication to be covered by your employers group health plan. If you have not already enrolled, you can sign up through [email or web address]

## 2025-01-08 NOTE — Telephone Encounter (Signed)
 Pharmacy Patient Advocate Encounter   Received notification from [employer] that prior authorization for Wegovy  0.25MG /0.5ML auto-injectors is required/requested.   Insurance verification completed.   The patient is insured through [facility].   Per test claim: PA required; PA submitted to above mentioned insurance via Latent Key/confirmation #/EOC [id number] Status is pending

## 2025-01-10 ENCOUNTER — Ambulatory Visit
Admission: RE | Admit: 2025-01-10 | Discharge: 2025-01-10 | Disposition: A | Discharge status: Home / Self Care | Source: Ambulatory Visit | Attending: Family | Admitting: Family

## 2025-01-10 ENCOUNTER — Ambulatory Visit
Admission: RE | Admit: 2025-01-10 | Discharge: 2025-01-10 | Disposition: A | Source: Ambulatory Visit | Attending: Family

## 2025-01-10 DIAGNOSIS — Z1231 Encounter for screening mammogram for malignant neoplasm of breast: Secondary | ICD-10-CM | POA: Insufficient documentation

## 2025-01-10 DIAGNOSIS — N6001 Solitary cyst of right breast: Secondary | ICD-10-CM | POA: Insufficient documentation

## 2025-06-27 ENCOUNTER — Ambulatory Visit: Admitting: Family
